# Patient Record
Sex: Male | Born: 1960 | Race: White | Hispanic: No | Marital: Married | Smoking: Never smoker
Health system: Southern US, Community
[De-identification: ages and names within clinical notes are randomized; demographics above are authoritative.]

## PROBLEM LIST (undated history)

## (undated) DIAGNOSIS — H269 Unspecified cataract: Secondary | ICD-10-CM

## (undated) DIAGNOSIS — F410 Panic disorder [episodic paroxysmal anxiety] without agoraphobia: Secondary | ICD-10-CM

## (undated) DIAGNOSIS — T7840XA Allergy, unspecified, initial encounter: Secondary | ICD-10-CM

## (undated) DIAGNOSIS — J302 Other seasonal allergic rhinitis: Secondary | ICD-10-CM

## (undated) DIAGNOSIS — H409 Unspecified glaucoma: Secondary | ICD-10-CM

## (undated) DIAGNOSIS — M1990 Unspecified osteoarthritis, unspecified site: Secondary | ICD-10-CM

## (undated) HISTORY — DX: Allergy, unspecified, initial encounter: T78.40XA

## (undated) HISTORY — PX: COLONOSCOPY: SHX174

## (undated) HISTORY — PX: REPLACEMENT TOTAL KNEE BILATERAL: SUR1225

## (undated) HISTORY — DX: Unspecified glaucoma: H40.9

## (undated) HISTORY — DX: Unspecified osteoarthritis, unspecified site: M19.90

## (undated) HISTORY — DX: Panic disorder (episodic paroxysmal anxiety): F41.0

## (undated) HISTORY — DX: Other seasonal allergic rhinitis: J30.2

## (undated) HISTORY — PX: TONSILLECTOMY: SUR1361

## (undated) HISTORY — DX: Unspecified cataract: H26.9

## (undated) HISTORY — PX: POLYPECTOMY: SHX149

## (undated) HISTORY — PX: KNEE SURGERY: SHX244

## (undated) HISTORY — PX: ROTATOR CUFF REPAIR: SHX139

---

## 1998-12-27 ENCOUNTER — Other Ambulatory Visit: Admission: RE | Admit: 1998-12-27 | Discharge: 1998-12-27 | Payer: Self-pay | Admitting: Otolaryngology

## 2005-05-21 ENCOUNTER — Inpatient Hospital Stay (HOSPITAL_COMMUNITY): Admission: EM | Admit: 2005-05-21 | Discharge: 2005-05-24 | Payer: Self-pay | Admitting: Emergency Medicine

## 2005-05-21 ENCOUNTER — Encounter: Payer: Self-pay | Admitting: Emergency Medicine

## 2005-09-12 ENCOUNTER — Emergency Department (HOSPITAL_COMMUNITY): Admission: EM | Admit: 2005-09-12 | Discharge: 2005-09-12 | Payer: Self-pay | Admitting: Emergency Medicine

## 2005-09-24 ENCOUNTER — Ambulatory Visit: Payer: Self-pay | Admitting: Internal Medicine

## 2005-09-25 ENCOUNTER — Ambulatory Visit: Payer: Self-pay | Admitting: Family Medicine

## 2005-10-02 ENCOUNTER — Ambulatory Visit: Payer: Self-pay | Admitting: Family Medicine

## 2005-10-05 ENCOUNTER — Ambulatory Visit: Payer: Self-pay | Admitting: Gastroenterology

## 2005-10-10 ENCOUNTER — Ambulatory Visit: Payer: Self-pay | Admitting: *Deleted

## 2005-10-12 ENCOUNTER — Ambulatory Visit: Payer: Self-pay

## 2005-12-25 ENCOUNTER — Ambulatory Visit: Payer: Self-pay | Admitting: Gastroenterology

## 2006-01-02 ENCOUNTER — Ambulatory Visit: Payer: Self-pay | Admitting: Gastroenterology

## 2006-01-02 ENCOUNTER — Encounter (INDEPENDENT_AMBULATORY_CARE_PROVIDER_SITE_OTHER): Payer: Self-pay | Admitting: Specialist

## 2006-07-22 ENCOUNTER — Ambulatory Visit: Payer: Self-pay | Admitting: Family Medicine

## 2006-07-22 LAB — CONVERTED CEMR LAB
ALT: 24 units/L (ref 0–40)
Albumin: 4 g/dL (ref 3.5–5.2)
Alkaline Phosphatase: 60 units/L (ref 39–117)
BUN: 17 mg/dL (ref 6–23)
CO2: 32 meq/L (ref 19–32)
Calcium: 9.7 mg/dL (ref 8.4–10.5)
Creatinine, Ser: 1.3 mg/dL (ref 0.4–1.5)
GFR calc non Af Amer: 63 mL/min
Hemoglobin: 15.2 g/dL (ref 13.0–17.0)
Lymphocytes Relative: 26 % (ref 12.0–46.0)
MCHC: 33.8 g/dL (ref 30.0–36.0)
Neutro Abs: 3.3 10*3/uL (ref 1.4–7.7)
Platelets: 175 10*3/uL (ref 150–400)
Triglyceride fasting, serum: 77 mg/dL (ref 0–149)
WBC: 5.1 10*3/uL (ref 4.5–10.5)

## 2006-07-30 ENCOUNTER — Ambulatory Visit: Payer: Self-pay | Admitting: Family Medicine

## 2007-07-15 DIAGNOSIS — Z85038 Personal history of other malignant neoplasm of large intestine: Secondary | ICD-10-CM | POA: Insufficient documentation

## 2007-08-15 ENCOUNTER — Telehealth: Payer: Self-pay | Admitting: Family Medicine

## 2007-09-09 ENCOUNTER — Ambulatory Visit: Payer: Self-pay | Admitting: Family Medicine

## 2007-09-09 LAB — CONVERTED CEMR LAB
ALT: 72 units/L — ABNORMAL HIGH (ref 0–53)
AST: 40 units/L — ABNORMAL HIGH (ref 0–37)
Basophils Absolute: 0 10*3/uL (ref 0.0–0.1)
Chloride: 106 meq/L (ref 96–112)
Direct LDL: 152.4 mg/dL
Glucose, Bld: 108 mg/dL — ABNORMAL HIGH (ref 70–99)
HCT: 44.6 % (ref 39.0–52.0)
Hemoglobin: 15.7 g/dL (ref 13.0–17.0)
Ketones, urine, test strip: NEGATIVE
MCHC: 35.1 g/dL (ref 30.0–36.0)
MCV: 89.3 fL (ref 78.0–100.0)
Nitrite: NEGATIVE
RBC: 4.99 M/uL (ref 4.22–5.81)
RDW: 12.4 % (ref 11.5–14.6)
Total Bilirubin: 1 mg/dL (ref 0.3–1.2)
Total CHOL/HDL Ratio: 6.4
Total Protein: 6.7 g/dL (ref 6.0–8.3)
Triglycerides: 151 mg/dL — ABNORMAL HIGH (ref 0–149)
VLDL: 30 mg/dL (ref 0–40)
WBC Urine, dipstick: NEGATIVE
WBC: 6.4 10*3/uL (ref 4.5–10.5)
pH: 7.5

## 2007-09-18 ENCOUNTER — Ambulatory Visit: Payer: Self-pay | Admitting: Family Medicine

## 2007-09-18 DIAGNOSIS — F4001 Agoraphobia with panic disorder: Secondary | ICD-10-CM | POA: Insufficient documentation

## 2007-09-18 DIAGNOSIS — D1739 Benign lipomatous neoplasm of skin and subcutaneous tissue of other sites: Secondary | ICD-10-CM

## 2007-09-26 ENCOUNTER — Ambulatory Visit: Payer: Self-pay | Admitting: Family Medicine

## 2007-09-26 DIAGNOSIS — L723 Sebaceous cyst: Secondary | ICD-10-CM

## 2007-10-06 ENCOUNTER — Ambulatory Visit: Payer: Self-pay | Admitting: Internal Medicine

## 2008-01-03 ENCOUNTER — Emergency Department (HOSPITAL_COMMUNITY): Admission: EM | Admit: 2008-01-03 | Discharge: 2008-01-03 | Payer: Self-pay | Admitting: Emergency Medicine

## 2008-07-09 ENCOUNTER — Encounter: Payer: Self-pay | Admitting: Family Medicine

## 2008-08-18 ENCOUNTER — Ambulatory Visit (HOSPITAL_BASED_OUTPATIENT_CLINIC_OR_DEPARTMENT_OTHER): Admission: RE | Admit: 2008-08-18 | Discharge: 2008-08-18 | Payer: Self-pay | Admitting: Orthopedic Surgery

## 2008-09-03 ENCOUNTER — Ambulatory Visit: Payer: Self-pay | Admitting: Family Medicine

## 2008-09-03 LAB — CONVERTED CEMR LAB
ALT: 51 units/L (ref 0–53)
AST: 29 units/L (ref 0–37)
Basophils Relative: 0.2 % (ref 0.0–3.0)
Bilirubin Urine: NEGATIVE
CO2: 36 meq/L — ABNORMAL HIGH (ref 19–32)
Calcium: 9.8 mg/dL (ref 8.4–10.5)
Creatinine, Ser: 1.1 mg/dL (ref 0.4–1.5)
GFR calc Af Amer: 92 mL/min
GFR calc non Af Amer: 76 mL/min
Glucose, Bld: 101 mg/dL — ABNORMAL HIGH (ref 70–99)
Glucose, Urine, Semiquant: NEGATIVE
Hemoglobin: 14.5 g/dL (ref 13.0–17.0)
Ketones, urine, test strip: NEGATIVE
Lymphocytes Relative: 24.7 % (ref 12.0–46.0)
Monocytes Absolute: 0.4 10*3/uL (ref 0.1–1.0)
Neutrophils Relative %: 64.5 % (ref 43.0–77.0)
Nitrite: NEGATIVE
Protein, U semiquant: NEGATIVE
Total Bilirubin: 0.8 mg/dL (ref 0.3–1.2)
Urobilinogen, UA: 0.2
VLDL: 22 mg/dL (ref 0–40)
WBC Urine, dipstick: NEGATIVE

## 2008-09-07 ENCOUNTER — Telehealth: Payer: Self-pay | Admitting: Family Medicine

## 2008-09-09 IMAGING — CR DG CHEST 1V PORT
1 series · 1 of 1 positions shown · non-contrast
Comparison: 05/22/05.

CLINICAL DATA: Chest pain and shortness of breath.
 PORTABLE CHEST - 1 VIEW:

[view not recorded]
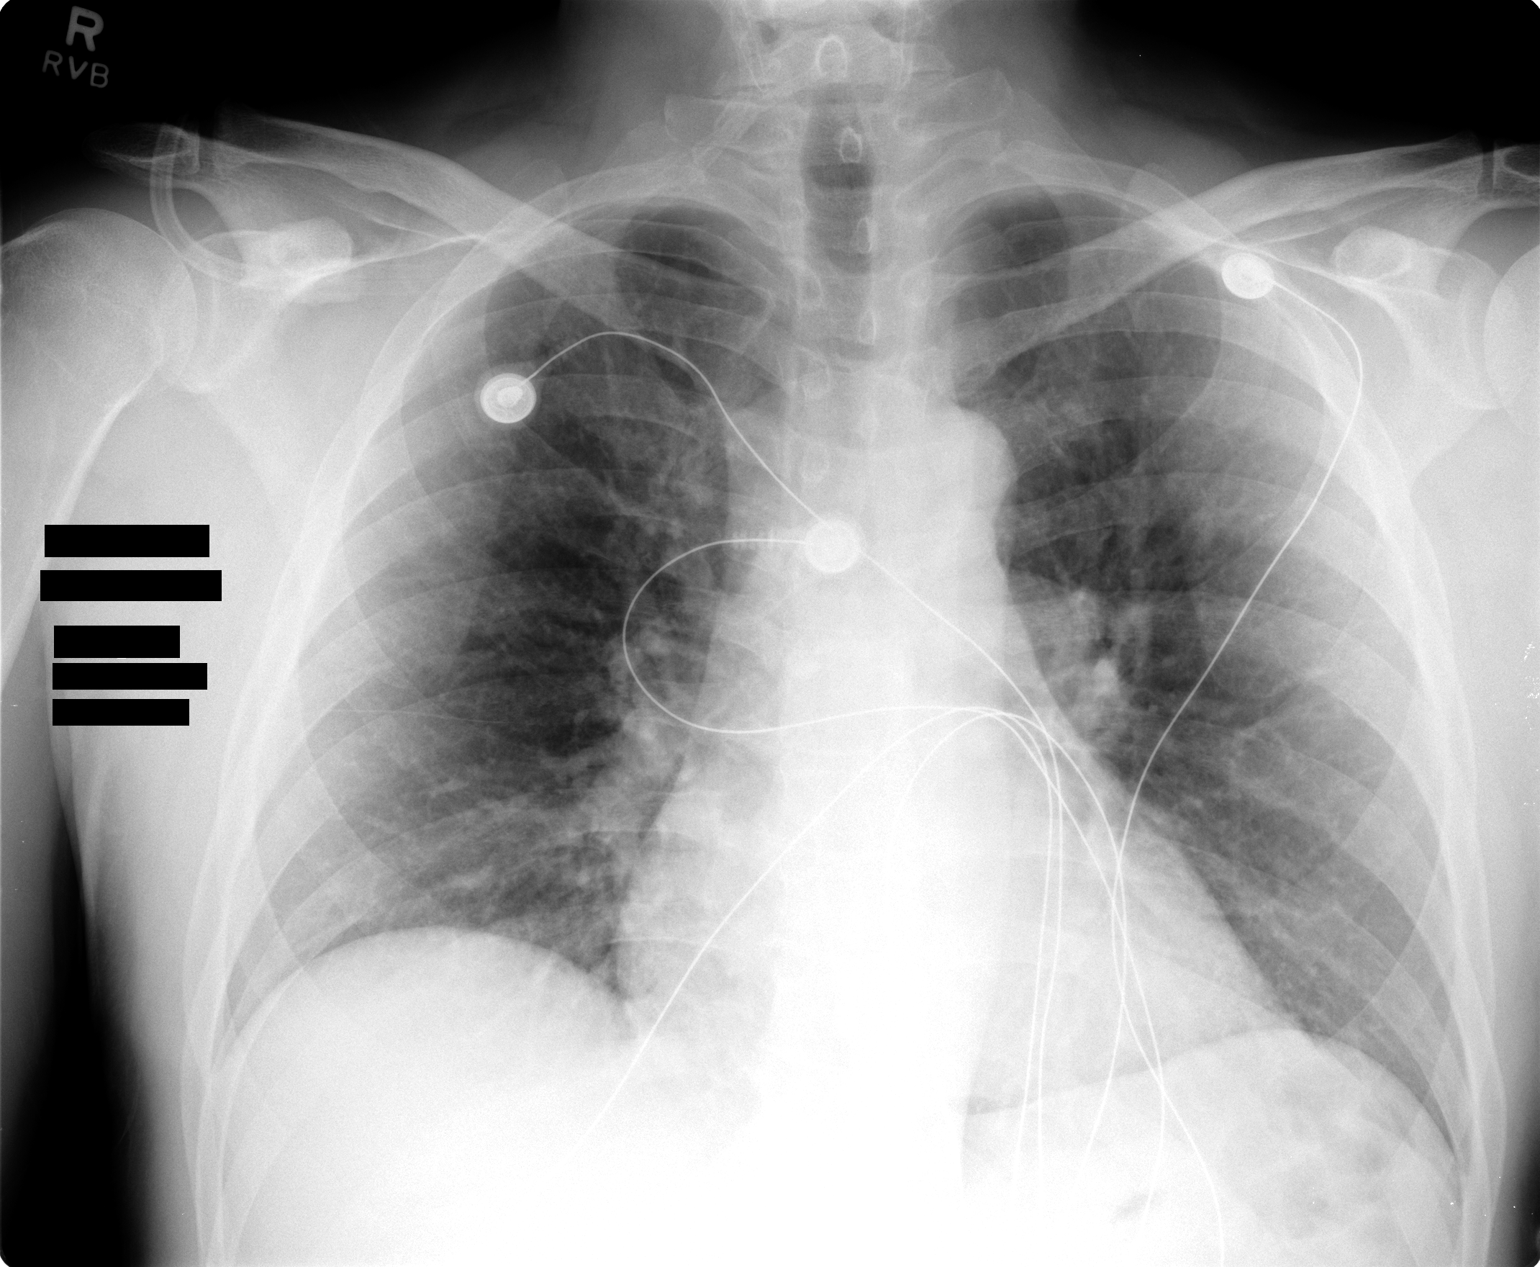

[1 of 1 positions shown; findings below may reference images not displayed]

FINDINGS: Infiltrate at the right lung base has resolved since the prior study.  Both lungs are clear.   The heart size and mediastinal contours are normal.
IMPRESSION: No active disease.

## 2008-09-17 ENCOUNTER — Ambulatory Visit: Payer: Self-pay | Admitting: Family Medicine

## 2008-09-17 DIAGNOSIS — Z860100 Personal history of colon polyps, unspecified: Secondary | ICD-10-CM | POA: Insufficient documentation

## 2008-10-12 ENCOUNTER — Telehealth: Payer: Self-pay | Admitting: Family Medicine

## 2009-04-06 ENCOUNTER — Telehealth: Payer: Self-pay | Admitting: *Deleted

## 2009-04-07 ENCOUNTER — Ambulatory Visit: Payer: Self-pay | Admitting: Family Medicine

## 2009-07-25 ENCOUNTER — Telehealth: Payer: Self-pay | Admitting: Gastroenterology

## 2009-09-05 ENCOUNTER — Ambulatory Visit: Payer: Self-pay | Admitting: Family Medicine

## 2009-09-05 LAB — CONVERTED CEMR LAB
BUN: 13 mg/dL (ref 6–23)
Basophils Absolute: 0 10*3/uL (ref 0.0–0.1)
Basophils Relative: 0.4 % (ref 0.0–3.0)
Bilirubin Urine: NEGATIVE
Blood in Urine, dipstick: NEGATIVE
Calcium: 9.4 mg/dL (ref 8.4–10.5)
Direct LDL: 162.6 mg/dL
Eosinophils Relative: 2.2 % (ref 0.0–5.0)
Glucose, Urine, Semiquant: NEGATIVE
HDL: 35.3 mg/dL — ABNORMAL LOW (ref >39.00)
Hemoglobin: 15.2 g/dL (ref 13.0–17.0)
Lymphocytes Relative: 23.7 % (ref 12.0–46.0)
Lymphs Abs: 1.4 10*3/uL (ref 0.7–4.0)
MCHC: 33.8 g/dL (ref 30.0–36.0)
MCV: 91.8 fL (ref 78.0–100.0)
Monocytes Absolute: 0.4 10*3/uL (ref 0.1–1.0)
Monocytes Relative: 6.7 % (ref 3.0–12.0)
Neutrophils Relative %: 67 % (ref 43.0–77.0)
PSA: 0.86 ng/mL (ref 0.10–4.00)
Platelets: 131 10*3/uL — ABNORMAL LOW (ref 150.0–400.0)
Protein, U semiquant: NEGATIVE
RBC: 4.91 M/uL (ref 4.22–5.81)
RDW: 12.7 % (ref 11.5–14.6)
Sodium: 145 meq/L (ref 135–145)
Total Bilirubin: 1 mg/dL (ref 0.3–1.2)
Urobilinogen, UA: 0.2
WBC: 5.7 10*3/uL (ref 4.5–10.5)

## 2009-09-19 ENCOUNTER — Ambulatory Visit: Payer: Self-pay | Admitting: Family Medicine

## 2009-12-23 ENCOUNTER — Telehealth: Payer: Self-pay | Admitting: Family Medicine

## 2010-06-08 ENCOUNTER — Telehealth: Payer: Self-pay | Admitting: *Deleted

## 2010-06-26 ENCOUNTER — Telehealth: Payer: Self-pay | Admitting: Family Medicine

## 2010-09-15 ENCOUNTER — Ambulatory Visit: Payer: Self-pay | Admitting: Family Medicine

## 2010-09-15 LAB — CONVERTED CEMR LAB
ALT: 23 U/L
AST: 24 U/L
Albumin: 3.8 g/dL
Alkaline Phosphatase: 49 U/L
BUN: 20 mg/dL
Basophils Absolute: 0 K/uL
Basophils Relative: 0.4 %
Bilirubin, Direct: 0.1 mg/dL
CO2: 33 meq/L — ABNORMAL HIGH
Calcium: 9.1 mg/dL
Chloride: 101 meq/L
Cholesterol: 157 mg/dL
Creatinine, Ser: 1 mg/dL
Eosinophils Absolute: 0.1 K/uL
Eosinophils Relative: 1.7 %
GFR calc non Af Amer: 81.02 mL/min
Glucose, Bld: 106 mg/dL — ABNORMAL HIGH
HCT: 42.9 %
HDL: 36.9 mg/dL — ABNORMAL LOW
Hemoglobin: 14.8 g/dL
Ketones, urine, test strip: NEGATIVE
LDL Cholesterol: 108 mg/dL — ABNORMAL HIGH
Lymphocytes Relative: 21.1 %
Lymphs Abs: 1 K/uL
MCHC: 34.4 g/dL
MCV: 92.8 fL
Monocytes Absolute: 0.4 K/uL
Monocytes Relative: 7.3 %
Neutro Abs: 3.4 K/uL
Neutrophils Relative %: 69.5 %
PSA: 0.82 ng/mL
Platelets: 117 K/uL — ABNORMAL LOW
Potassium: 4 meq/L
RBC: 4.62 M/uL
RDW: 13.5 %
Sodium: 141 meq/L
TSH: 1.52 u[IU]/mL
Total Bilirubin: 0.8 mg/dL
Total CHOL/HDL Ratio: 4
Total Protein: 6.6 g/dL
Triglycerides: 62 mg/dL
VLDL: 12.4 mg/dL
WBC: 4.9 10*3/microliter

## 2010-09-22 ENCOUNTER — Ambulatory Visit: Payer: Self-pay | Admitting: Family Medicine

## 2010-09-22 ENCOUNTER — Encounter: Payer: Self-pay | Admitting: Family Medicine

## 2010-09-22 DIAGNOSIS — N529 Male erectile dysfunction, unspecified: Secondary | ICD-10-CM | POA: Insufficient documentation

## 2010-09-22 DIAGNOSIS — J309 Allergic rhinitis, unspecified: Secondary | ICD-10-CM | POA: Insufficient documentation

## 2010-11-03 ENCOUNTER — Telehealth: Payer: Self-pay | Admitting: Family Medicine

## 2010-11-14 NOTE — Progress Notes (Signed)
Summary: REFILL  Phone Note Refill Request Message from:  Fax from Pharmacy  Refills Requested: Medication #1:  CIALIS 20 MG TABS UAD. [facility] FA[facility]----[phone]  Initial call taken by: Warnell Forester,  December 23, 2009 10:09 AM    Prescriptions: Prudy Feeler 1 MG  TABS (ALPRAZOLAM) 1 tab 6 x per day  #540 x 3   Entered by:   Kern Reap CMA (AAMA)   Authorized by:   Roderick Pee MD   Signed by:   Kern Reap CMA (AAMA) on 12/23/2009   Method used:   Print then Give to Patient   RxID:   [id number] CIALIS 20 MG TABS (TADALAFIL) UAD  #6 x 11   Entered by:   Kern Reap CMA (AAMA)   Authorized by:   Roderick Pee MD   Signed by:   Kern Reap CMA (AAMA) on 12/23/2009   Method used:   Electronically to        [facility]* (mail-order)             ,          Ph: [phone]       Fax: [phone]   RxID:   [id number] PIRJJ 1 MG  TABS (ALPRAZOLAM) 1 tab 6 x per day  #540 x 3   Entered by:   Kern Reap CMA (AAMA)   Authorized by:   Roderick Pee MD   Signed by:   Kern Reap CMA (AAMA) on 12/23/2009   Method used:   Print then Give to Patient   RxID:   [id number]

## 2010-11-14 NOTE — Assessment & Plan Note (Signed)
Summary: cpx/njr   Vital Signs:  Patient profile:   50 year old male Height:      73 inches Weight:      212 pounds BMI:     28.07 Temp:     98.3 degrees F oral BP sitting:   110 / 74  (left arm) Cuff size:   regular  Vitals Entered By: Kern Reap CMA Duncan Dull) (September 22, 2010 10:40 AM)  History of Present Illness: Annette Stable is a 50 year old male, married, nonsmoker, who comes in today for general physical examination because of the history of underlying Agoral phobia with panic disorder, for which she takes Xanax 1 mg 6 times per day.  Also has a tremor benign for which he takes Tenormin 50 mg daily.  He uses Flonase nasal spray and Allegra for allergic rhinitis and Cialis 20 mg p.r.n. for ED.  He gets routine eye care, dental care, colonoscopy, 2007 showed some, polyps.  He's on the recall  list.  Tetanus 2007 declines a flu shot.  He's currently on fish oil b.i.d. from his rheumatologist for pain in his knees.  He would also like a testosterone level.  Is concerned about his issues with erectile dysfunction.  However, his anatomy is normal  His oldest son is now a Holiday representative at [employer], another son, a Printmaker in college, and a daughter, who is a Holiday representative at [employer].  He and his wife are having marital problems and currently seeing a marriage counselor  Allergies: 1)  ! Codeine  Past History:  Past medical, surgical, family and social histories (including risk factors) reviewed, and no changes noted (except as noted below).  Past Medical History: Reviewed history from 09/17/2008 and no changes required. Panic Attack Colonic polyps, hx of  Past Surgical History: Reviewed history from 09/17/2008 and no changes required. Tonsillectomy Colonoscopy Panendoscopy right and left shoulder surgery 09  Family History: Reviewed history from 09/18/2007 and no changes required. Family History of Fibromyalgia Family History of Bowel disease Family History Breast cancer 1st degree relative  <50 Family History of Colon CA 1st degree relative <60  Social History: Reviewed history from 09/17/2008 and no changes required. Occupation: Married Never Smoked Alcohol use-no Drug use-no Regular exercise-yes  Review of Systems      See HPI  Physical Exam  General:  Well-developed,well-nourished,in no acute distress; alert,appropriate and cooperative throughout examination Head:  Normocephalic and atraumatic without obvious abnormalities. No apparent alopecia or balding. Eyes:  No corneal or conjunctival inflammation noted. EOMI. Perrla. Funduscopic exam benign, without hemorrhages, exudates or papilledema. Vision grossly normal. Ears:  External ear exam shows no significant lesions or deformities.  Otoscopic examination reveals clear canals, tympanic membranes are intact bilaterally without bulging, retraction, inflammation or discharge. Hearing is grossly normal bilaterally. Nose:  External nasal examination shows no deformity or inflammation. Nasal mucosa are pink and moist without lesions or exudates. Mouth:  Oral mucosa and oropharynx without lesions or exudates.  Teeth in good repair. Neck:  No deformities, masses, or tenderness noted. Chest Wall:  No deformities, masses, tenderness or gynecomastia noted. Breasts:  No masses or gynecomastia noted Lungs:  Normal respiratory effort, chest expands symmetrically. Lungs are clear to auscultation, no crackles or wheezes. Heart:  Normal rate and regular rhythm. S1 and S2 normal without gallop, murmur, click, rub or other extra sounds. Abdomen:  Bowel sounds positive,abdomen soft and non-tender without masses, organomegaly or hernias noted. Rectal:  No external abnormalities noted. Normal sphincter tone. No rectal masses or tenderness. Genitalia:  Testes bilaterally descended without nodularity, tenderness or masses. No scrotal masses or lesions. No penis lesions or urethral discharge. Prostate:  Prostate gland firm and smooth, no  enlargement, nodularity, tenderness, mass, asymmetry or induration. Msk:  No deformity or scoliosis noted of thoracic or lumbar spine.   Pulses:  R and L carotid,radial,femoral,dorsalis pedis and posterior tibial pulses are full and equal bilaterally Extremities:  No clubbing, cyanosis, edema, or deformity noted with normal full range of motion of all joints.   Neurologic:  No cranial nerve deficits noted. Station and gait are normal. Plantar reflexes are down-going bilaterally. DTRs are symmetrical throughout. Sensory, motor and coordinative functions appear intact. Skin:  Intact without suspicious lesions or rashes Cervical Nodes:  No lymphadenopathy noted Axillary Nodes:  No palpable lymphadenopathy Inguinal Nodes:  No significant adenopathy Psych:  Cognition and judgment appear intact. Alert and cooperative with normal attention span and concentration. No apparent delusions, illusions, hallucinations   Impression & Recommendations:  Problem # 1:  AGORAPHOBIA WITH PANIC DISORDER (ICD-300.21) Assessment Improved  Orders: Prescription Created Electronically [phone])  Problem # 2:  PHYSICAL EXAMINATION (ICD-V70.0) Assessment: Unchanged  Orders: Prescription Created Electronically [phone]) EKG w/ Interpretation (93000)  Problem # 3:  ERECTILE DYSFUNCTION, ORGANIC (ICD-607.84) Assessment: New  His updated medication list for this problem includes:    Cialis 20 Mg Tabs (Tadalafil) ..... Uad  Orders: Venipuncture (95621) TLB-Testosterone, Total (84403-TESTO) Prescription Created Electronically [phone])  Problem # 4:  ALLERGIC RHINITIS (ICD-477.9) Assessment: Unchanged  His updated medication list for this problem includes:    Flonase 50 Mcg/act Susp (Fluticasone propionate) .Marland Kitchen... As needed    Allegra 180 Mg Tabs (Fexofenadine hcl) .Marland Kitchen... Take 1 tablet by mouth every morning  Orders: Prescription Created Electronically [phone])  Complete Medication List: 1)  Tenormin 50 Mg Tabs  (Atenolol) .... Take 1 tablet by mouth once a day 2)  Flonase 50 Mcg/act Susp (Fluticasone propionate) .... As needed 3)  Xanax 1 Mg Tabs (Alprazolam) .Marland Kitchen.. 1 tab 6 x per day 4)  Allegra 180 Mg Tabs (Fexofenadine hcl) .... Take 1 tablet by mouth every morning 5)  Cialis 20 Mg Tabs (Tadalafil) .... Uad 6)  Lovaza 1 Gm Caps (Omega-3-acid ethyl esters) .... Take one tab two times a day  Patient Instructions: 1)  Please schedule a follow-up appointment in 1 year. 2)  It is important that you exercise regularly at least 20 minutes 5 times a week. If you develop chest pain, have severe difficulty breathing, or feel very tired , stop exercising immediately and seek medical attention. 3)  Take an Aspirin every day. Prescriptions: XANAX 1 MG  TABS (ALPRAZOLAM) 1 tab 6 x per day  #540 x 3   Entered and Authorized by:   Roderick Pee MD   Signed by:   Roderick Pee MD on 09/22/2010   Method used:   Print then Give to Patient   RxID:   [id number] CIALIS 20 MG TABS (TADALAFIL) UAD  #6 x 4   Entered and Authorized by:   Roderick Pee MD   Signed by:   Roderick Pee MD on 09/22/2010   Method used:   Electronically to        [facility]* (retail)             ,          Ph: [phone]       Fax: [phone]   RxID:   [id number] ALLEGRA 180 MG TABS (FEXOFENADINE HCL) Take  1 tablet by mouth every morning  #100 x 3   Entered and Authorized by:   Roderick Pee MD   Signed by:   Roderick Pee MD on 09/22/2010   Method used:   Electronically to        [facility]* (retail)             ,          Ph: [phone]       Fax: [phone]   RxID:   [id number] FLONASE 50 MCG/ACT  SUSP (FLUTICASONE PROPIONATE) as needed  #3 x 3   Entered and Authorized by:   Roderick Pee MD   Signed by:   Roderick Pee MD on 09/22/2010   Method used:   Electronically to        [facility]* (retail)             ,          Ph: [phone]       Fax: [phone]   RxID:    [id number] TENORMIN 50 MG  TABS (ATENOLOL) Take 1 tablet by mouth once a day  #100 x 3   Entered and Authorized by:   Roderick Pee MD   Signed by:   Roderick Pee MD on 09/22/2010   Method used:   Electronically to        [facility]* (retail)             ,          Ph: [phone]       Fax: [phone]   RxID:   [id number]    Orders Added: 1)  Venipuncture [25427] 2)  TLB-Testosterone, Total [84403-TESTO] 3)  Prescription Created Electronically [G8553] 4)  Est. Patient 40-64 years [99396] 5)  EKG w/ Interpretation [93000]      Preventive Care Screening  Colonoscopy:    Date:  10/15/2005    Results:  polyps

## 2010-11-14 NOTE — Progress Notes (Signed)
Summary: cialis refill  Phone Note Refill Request Message from:  Fax from Pharmacy on June 26, 2010 12:29 PM  Refills Requested: Medication #1:  CIALIS 20 MG TABS UAD. Initial call taken by: Kern Reap CMA Duncan Dull),  June 26, 2010 12:29 PM    Prescriptions: CIALIS 20 MG TABS (TADALAFIL) UAD  #6 x 4   Entered by:   Kern Reap CMA (AAMA)   Authorized by:   Roderick Pee MD   Signed by:   Kern Reap CMA (AAMA) on 06/26/2010   Method used:   Electronically to        [facility] (retail)             ,          Ph: [phone]       Fax: [phone]   RxID:   [id number]

## 2010-11-14 NOTE — Progress Notes (Signed)
  Phone Note Refill Request Message from:  Fax from Pharmacy on June 08, 2010 11:24 AM  Refills Requested: Medication #1:  XANAX 1 MG  TABS 1 tab 6 x per day Initial call taken by: Kern Reap CMA Duncan Dull),  June 08, 2010 11:24 AM    Prescriptions: Prudy Feeler 1 MG  TABS (ALPRAZOLAM) 1 tab 6 x per day  #540 x 3   Entered by:   Kern Reap CMA (AAMA)   Authorized by:   Roderick Pee MD   Signed by:   Kern Reap CMA (AAMA) on 06/08/2010   Method used:   Print then Give to Patient   RxID:   [id number] Prudy Feeler 1 MG  TABS (ALPRAZOLAM) 1 tab 6 x per day  #540 x 3   Entered by:   Kern Reap CMA (AAMA)   Authorized by:   Roderick Pee MD   Signed by:   Kern Reap CMA (AAMA) on 06/08/2010   Method used:   Print then Give to Patient   RxID:   [id number]

## 2010-11-16 NOTE — Progress Notes (Signed)
Summary: REFILL REQUEST (PLEASE SEE NOTATION)  Phone Note Refill Request Message from:  Patient on November 03, 2010 1:56 PM  Refills Requested: Medication #1:  XANAX 1 MG  TABS 1 tab 6 x per day   Notes: Pt will bring form by that needs to be faxed in w/ RX... Please sign dispense as written.  See Notation. needs brand name.   Initial call taken by: Debbra Riding,  November 03, 2010 1:56 PM    Prescriptions: Prudy Feeler 1 MG  TABS (ALPRAZOLAM) 1 tab 6 x per day  #540 x 3   Entered by:   Kern Reap CMA (AAMA)   Authorized by:   Roderick Pee MD   Signed by:   Kern Reap CMA (AAMA) on 11/03/2010   Method used:   Print then Give to Patient   RxID:   [id number]

## 2011-02-20 ENCOUNTER — Ambulatory Visit (AMBULATORY_SURGERY_CENTER): Payer: BC Managed Care – PPO

## 2011-02-20 VITALS — Ht 73.0 in | Wt 213.2 lb

## 2011-02-20 DIAGNOSIS — Z800 Family history of malignant neoplasm of digestive organs: Secondary | ICD-10-CM

## 2011-02-20 DIAGNOSIS — D131 Benign neoplasm of stomach: Secondary | ICD-10-CM

## 2011-02-20 DIAGNOSIS — Z860100 Personal history of colon polyps, unspecified: Secondary | ICD-10-CM

## 2011-02-20 MED ORDER — PEG-KCL-NACL-NASULF-NA ASC-C 100 G PO SOLR: 1.0000 | Freq: Once | ORAL | Status: AC

## 2011-02-21 ENCOUNTER — Encounter: Payer: Self-pay | Admitting: Gastroenterology

## 2011-02-27 NOTE — Op Note (Signed)
NAME:  Logan [name], Logan [name] NO.:  [id number]   MEDICAL RECORD NO.:  [id number]          PATIENT TYPE:  AMB   LOCATION:  DSC                          FACILITY:  [facility]   PHYSICIAN:  Harvie Junior, M.D.   DATE OF BIRTH:  1961/08/29   DATE OF PROCEDURE:  DATE OF DISCHARGE:                               OPERATIVE REPORT   PREOPERATIVE DIAGNOSES:  Impingement and AC joint arthritis.   POSTOPERATIVE DIAGNOSES:  1. Impingement.  2. Impingement from the distal clavicle.  3. Superior labial tear anterior to posterior.  4. Undersurface rotator cuff tear.  5. Articular cartilage defect on the anterior inferior glenoid.   PRINCIPAL PROCEDURE:  1. Arthroscopic subacromial decompression of the lateral and posterior      compartment.  2. Arthroscopic distal clavicle resection to an anterior compartment.  3. Debridement within the glenohumeral joint of undersurface rotator      cuff tear of anterior inferior articular cartilage defect.  4. Superior labial tear anterior to posterior.   SURGEON:  Harvie Junior, M.D.   ASSISTANT:  Marshia Ly, P.A.   ANESTHESIA:  General.   BRIEF HISTORY:  Logan [name] is a 50 year old gentleman with a long  history of having had significant pain in bilateral shoulders.  We had  seen him in the office.  He had undergone injection therapy, which had  helped, but pain persisted despite physical therapy over a period of  time.  Because he continued to complain of pain, he was ultimately taken  to the operating room for operative shoulder arthroscopy.   PROCEDURE:  The patient was taken to the operating room.  After adequate  anesthesia was obtained with general anesthetic, the patient was placed  supine on the operating room table.  Left shoulder was prepped and  draped in the usual sterile fashion.  Following this, a routine  arthroscopic examination of the shoulder revealed there was an obvious  undersurface rotator cuff tear that was quite  significant over the  entire length of the supraspinatus.  We debrided this back to a smooth  and stable rim and we are still concerned about the high-grade partial  nature of it at this point, we used a spinal nail and marked the rotator  cuff within the glenohumeral joint to give [place] some sense of where this  would be, so we can look at it from the topside.  At this point, the  attention was turned to the anterior inferior glenoid where there was an  articular cartilage defect, which was debrided.  Attention was turned to  the superior labrum, which was torn and this was anterior to posterior.  Took the biceps tendon at that point and tried to force this in the  joint, but the biceps tendon was well anchored superiorly.  At this  time, attention was turned towards out of the glenohumeral joint into  the subacromial space and an anterolateral acromioplasty was performed  for the lateral and posterior compartments.  The rotator cuff was then  probed and evaluated at a length from the superior area.  We identified  where the  suture pierced the rotator cuff and then probed that at length  with both the arm and abduction and an adduction, and felt that the  rotator cuff was sufficient thickness to withstand this situation.  Once  the acromioplasty was performed, we looked at the distal clavicle.  Initial plan was to leave the distal clavicle alone, but it did appear  to be an impinging lesion and because of the findings of the superior  labral issues as well as the high-grade partial nature of the cuff tear,  we felt that we really needed to decompress this shoulder well, so a  distal clavicle resection was performed over 18 mm and once this was  performed a subtotal bursectomy was performed and the shoulder was  copiously and thoroughly irrigated and suction dried.  The arthroscopic  portals were then closed with the bandage and sterile compression  dressing was applied.  The patient was  taken to the recovery room and  was noted to be in a satisfactory condition.  Estimated blood loss for  procedure was none.      Harvie Junior, M.D.  Electronically Signed     JLG/MEDQ  D:  08/18/2008  T:  08/19/2008  Job:  [id number]

## 2011-03-02 NOTE — Discharge Summary (Signed)
NAME:  DECKLAN, MAU NO.:  [id number]   MEDICAL RECORD NO.:  [id number]          PATIENT TYPE:  INP   LOCATION:  5020                         FACILITY:  [facility]   PHYSICIAN:  Cherylynn Ridges, M.D.    DATE OF BIRTH:  1961/06/22   DATE OF ADMISSION:  05/21/2005  DATE OF DISCHARGE:  05/24/2005                                 DISCHARGE SUMMARY   DISCHARGE DIAGNOSES:  1.  Fall during wake-boarding.  2.  Intra-abdominal injury, likely mesenteric.  3.  Left hip contusion.  4.  Left medial collateral ligament tear.  5.  Anxiety disorder not otherwise specified.  6.  Acute blood loss anemia.  7.  Mild thrombocytopenia.   CONSULTS:  Dr. Luiz Blare, orthopedics.   PROCEDURES:  None.   HISTORY OF PRESENT ILLNESS:  This is a 50 year old white male who was wake-  boarding the day before presentation and fell hard on his left side.  He  developed progressive pain in his left shoulder and epigastrium.  He also  was having significant left knee pain and had gone to see his orthopedic  surgeon, Dr. Luiz Blare.  Dr. Luiz Blare and/or his P.A. thought that he might  potentially have a pneumothorax and sent him to be evaluated.   The patient's workup included a chest x-ray which was negative.  He also had  a CT of the abdomen and pelvis which showed a large amount of free fluid  from a likely mesenteric injury although a small spleen laceration was not  excluded.  He was admitted for observation and serial CBCs.   HOSPITAL COURSE:  The patient did well in the hospital.  His hemoglobin  drifted down for a couple of days until it stabilized and then trended up on  his last hospital day.  He was felt well enough to go to MRI towards the end  of his hospital course which demonstrated the medial collateral ligament  tear.  He was prescribed a hinged knee brace when ambulating for that.  He  was felt to be able to be discharged home in good condition on his last  hospital day.   DISCHARGE  MEDICATIONS:  1.  Vicodin 5/500, take 1-2 p.o. q.6h. p.r.n. pain, #50 with no refill.  2.  Continue his home medications which include Tenormin and Xanax for his      anxiety disorder.   Finally, he was taking an aspirin daily plus Motrin for various orthopedic  complaints which is to stop for the next 2 months.   FOLLOW-UP:  The patient is to follow up with Dr. Luiz Blare in two weeks for his  collateral ligament tear.  He is to call the trauma service if he develops  any problems.  He was warned against any vigorous exercise for the next two  months although he was cleared to fly next week.      Earney Hamburg, P.A.      Cherylynn Ridges, M.D.  Electronically Signed    MJ/MEDQ  D:  05/24/2005  T:  05/24/2005  Job:  [id number]   cc:   Harvie Junior,  M.D.  [address]  [place]  [place] [address]  Fax: [phone]

## 2011-03-05 ENCOUNTER — Encounter: Payer: Self-pay | Admitting: Gastroenterology

## 2011-03-05 DIAGNOSIS — K5730 Diverticulosis of large intestine without perforation or abscess without bleeding: Secondary | ICD-10-CM | POA: Insufficient documentation

## 2011-03-06 ENCOUNTER — Ambulatory Visit (AMBULATORY_SURGERY_CENTER): Payer: BC Managed Care – PPO | Admitting: Gastroenterology

## 2011-03-06 ENCOUNTER — Encounter: Payer: Self-pay | Admitting: Gastroenterology

## 2011-03-06 DIAGNOSIS — Z800 Family history of malignant neoplasm of digestive organs: Secondary | ICD-10-CM

## 2011-03-06 DIAGNOSIS — Z860100 Personal history of colon polyps, unspecified: Secondary | ICD-10-CM

## 2011-03-06 DIAGNOSIS — D131 Benign neoplasm of stomach: Secondary | ICD-10-CM

## 2011-03-06 DIAGNOSIS — Z1211 Encounter for screening for malignant neoplasm of colon: Secondary | ICD-10-CM

## 2011-03-06 MED ORDER — SODIUM CHLORIDE 0.9 % IV SOLN: 500.0000 mL | INTRAVENOUS | Status: DC

## 2011-03-06 NOTE — Progress Notes (Signed)
LAST PROCEDURE PT HAD FENTANYL, 14 MG VERSED AND REMEMBERS PROCEDURE. PER DR STARK, START WITH 25 MG BEANADYL. MEDICINES TITRATED PER MD. Brandy Hale, RN

## 2011-03-07 ENCOUNTER — Telehealth: Payer: Self-pay | Admitting: *Deleted

## 2011-03-07 NOTE — Telephone Encounter (Signed)

## 2011-03-19 ENCOUNTER — Other Ambulatory Visit: Payer: Self-pay | Admitting: *Deleted

## 2011-03-19 ENCOUNTER — Other Ambulatory Visit: Payer: Self-pay | Admitting: Family Medicine

## 2011-03-19 MED ORDER — ALPRAZOLAM 1 MG PO TABS: 1.0000 mg | ORAL_TABLET | Freq: Four times a day (QID) | ORAL | Status: DC

## 2011-03-19 NOTE — Telephone Encounter (Signed)
Pt called to check on status for script for xanax 1 mg, name brand only, dispensed as written #540 6 times per day. Pls be sure that this med is sent in to [facility] asap.

## 2011-03-19 NOTE — Telephone Encounter (Signed)
Pt states he dropped on fax order from for xanax refill to be sent to [facility] one week ago today.  [facility] still has not received refill request.  Pt checking the status of refill, also please make sure signature is for dispense as written.

## 2011-03-21 MED ORDER — XANAX 1 MG PO TABS: 1.0000 mg | ORAL_TABLET | Freq: Every day | ORAL | Status: DC

## 2011-07-09 LAB — POCT CARDIAC MARKERS
CKMB, poc: <1 — ABNORMAL LOW
Myoglobin, poc: 27.2
Operator id: 4661
Troponin i, poc: <0.05

## 2011-07-09 LAB — CBC
MCHC: 33.8
MCV: 88.3
Platelets: 132 — ABNORMAL LOW
RBC: 5.06
WBC: 9.2

## 2011-07-09 LAB — DIFFERENTIAL
Basophils Relative: 0
Eosinophils Absolute: 0.1
Eosinophils Relative: 1
Monocytes Relative: 7
Neutrophils Relative %: 69

## 2011-07-09 LAB — BASIC METABOLIC PANEL
BUN: 17
Calcium: 9.4
Chloride: 104
Creatinine, Ser: 1.08
GFR calc Af Amer: >60

## 2011-07-17 LAB — BASIC METABOLIC PANEL
CO2: 29
Chloride: 102
Creatinine, Ser: 1.06
GFR calc Af Amer: >60
Potassium: 4.2
Sodium: 138

## 2011-09-18 ENCOUNTER — Telehealth: Payer: Self-pay | Admitting: Family Medicine

## 2011-09-18 NOTE — Telephone Encounter (Signed)
Refill Xanax and Tenormin written out mail order. He would like to pick up today. Thanks.

## 2011-09-18 NOTE — Telephone Encounter (Signed)
Ok per dr Tawanna Cooler

## 2011-09-19 ENCOUNTER — Telehealth: Payer: Self-pay | Admitting: Family Medicine

## 2011-09-19 MED ORDER — XANAX 1 MG PO TABS: 1.0000 mg | ORAL_TABLET | Freq: Every day | ORAL | Status: DC

## 2011-09-19 MED ORDER — ATENOLOL 50 MG PO TABS: 50.0000 mg | ORAL_TABLET | Freq: Every day | ORAL | Status: DC

## 2011-09-19 NOTE — Telephone Encounter (Signed)
Opened in error

## 2011-09-19 NOTE — Telephone Encounter (Signed)
Rx ready for pick up.  Left message on machine for patient. 

## 2011-09-19 NOTE — Telephone Encounter (Signed)
Pt would like you to contact him before you fax rx in. Pt is requesting only Name Brand

## 2011-10-04 ENCOUNTER — Other Ambulatory Visit (INDEPENDENT_AMBULATORY_CARE_PROVIDER_SITE_OTHER): Payer: BC Managed Care – PPO

## 2011-10-04 DIAGNOSIS — Z0000 Encounter for general adult medical examination without abnormal findings: Secondary | ICD-10-CM

## 2011-10-04 LAB — CBC WITH DIFFERENTIAL/PLATELET
Basophils Absolute: 0 10*3/uL (ref 0.0–0.1)
Basophils Relative: 0.4 % (ref 0.0–3.0)
Eosinophils Absolute: 0.1 10*3/uL (ref 0.0–0.7)
HCT: 45.3 % (ref 39.0–52.0)
Hemoglobin: 15.4 g/dL (ref 13.0–17.0)
Lymphs Abs: 1.5 10*3/uL (ref 0.7–4.0)
MCHC: 33.9 g/dL (ref 30.0–36.0)
MCV: 91.6 fl (ref 78.0–100.0)
Neutro Abs: 4 10*3/uL (ref 1.4–7.7)
RBC: 4.95 Mil/uL (ref 4.22–5.81)
RDW: 13.4 % (ref 11.5–14.6)

## 2011-10-04 LAB — POCT URINALYSIS DIPSTICK
Bilirubin, UA: NEGATIVE
Ketones, UA: NEGATIVE
Leukocytes, UA: NEGATIVE
Protein, UA: NEGATIVE
Spec Grav, UA: 1.015

## 2011-10-04 LAB — HEPATIC FUNCTION PANEL
AST: 21 U/L (ref 0–37)
Albumin: 4.2 g/dL (ref 3.5–5.2)
Total Protein: 7.1 g/dL (ref 6.0–8.3)

## 2011-10-04 LAB — BASIC METABOLIC PANEL
CO2: 31 mEq/L (ref 19–32)
Chloride: 106 mEq/L (ref 96–112)
Glucose, Bld: 102 mg/dL — ABNORMAL HIGH (ref 70–99)
Potassium: 4.8 mEq/L (ref 3.5–5.1)
Sodium: 143 mEq/L (ref 135–145)

## 2011-10-04 LAB — LIPID PANEL
Cholesterol: 176 mg/dL (ref 0–200)
HDL: 46.8 mg/dL (ref >39.00)
VLDL: 12.4 mg/dL (ref 0.0–40.0)

## 2011-10-04 LAB — PSA: PSA: 0.99 ng/mL (ref 0.10–4.00)

## 2011-10-25 ENCOUNTER — Ambulatory Visit (INDEPENDENT_AMBULATORY_CARE_PROVIDER_SITE_OTHER): Payer: BC Managed Care – PPO | Admitting: Family Medicine

## 2011-10-25 ENCOUNTER — Encounter: Payer: Self-pay | Admitting: Family Medicine

## 2011-10-25 DIAGNOSIS — F4001 Agoraphobia with panic disorder: Secondary | ICD-10-CM

## 2011-10-25 DIAGNOSIS — J309 Allergic rhinitis, unspecified: Secondary | ICD-10-CM

## 2011-10-25 DIAGNOSIS — Z0000 Encounter for general adult medical examination without abnormal findings: Secondary | ICD-10-CM

## 2011-10-25 MED ORDER — ALPRAZOLAM 1 MG PO TABS: 1.0000 mg | ORAL_TABLET | Freq: Every day | ORAL | Status: DC

## 2011-10-25 MED ORDER — ATENOLOL 50 MG PO TABS: 50.0000 mg | ORAL_TABLET | Freq: Every day | ORAL | Status: DC

## 2011-10-25 NOTE — Progress Notes (Signed)
  Subjective:    Patient ID: Logan [name], male    DOB: [birth year], 51 y.o.   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  Intermittent constipation with internal and external hemorrhoids and a new lesion on his left lower eyelid.  He takes Tenormin 50 mg daily, and Xanax 1 mg 6 times daily for anxiety.  He's tried to taper the Xanax, but every time he does he goes through withdrawal.  He is content to continue to take the medication.  He takes Celebrex 200 mg daily for os to arthritis of his knees.  Recommended Motrin 600 b.i.d. As a substitute.  He sees a rheumatologist, and an orthopedist for his knees.  His intermittent constipation, which showed developed some in the paternal external hemorrhoids.  Advised milk of Magnesia.  Tetanus was 2007 seasonal flu shot 2012     Review of Systems  Constitutional: Negative.   HENT: Negative.   Eyes: Negative.   Respiratory: Negative.   Cardiovascular: Negative.   Gastrointestinal: Negative.   Genitourinary: Negative.   Musculoskeletal: Negative.   Skin: Negative.   Neurological: Negative.   Hematological: Negative.   Psychiatric/Behavioral: Negative.        Objective:   Physical Exam  Constitutional: He is oriented to person, place, and time. He appears well-developed and well-nourished.  HENT:  Head: Normocephalic and atraumatic.  Right Ear: External ear normal.  Left Ear: External ear normal.  Nose: Nose normal.  Mouth/Throat: Oropharynx is clear and moist.  Eyes: Conjunctivae and EOM are normal. Pupils are equal, round, and reactive to light.  Neck: Normal range of motion. Neck supple. No JVD present. No tracheal deviation present. No thyromegaly present.  Cardiovascular: Normal rate, regular rhythm, normal heart sounds and intact distal pulses.  Exam reveals no gallop  and no friction rub.   No murmur heard. Pulmonary/Chest: Effort normal and breath sounds normal. No stridor. No respiratory distress. He has no wheezes. He has no rales. He exhibits no tenderness.  Abdominal: Soft. Bowel sounds are normal. He exhibits no distension and no mass. There is no tenderness. There is no rebound and no guarding.  Genitourinary: Rectum normal, prostate normal and penis normal. Guaiac negative stool. No penile tenderness.       External exam normal.  He has a very small, scarred down, external hemorrhoid tag at the 6 o'clock position  Musculoskeletal: Normal range of motion. He exhibits no edema and no tenderness.  Lymphadenopathy:    He has no cervical adenopathy.  Neurological: He is alert and oriented to person, place, and time. He has normal reflexes. No cranial nerve deficit. He exhibits normal muscle tone.  Skin: Skin is warm and dry. No rash noted. No erythema. No pallor.  Psychiatric: He has a normal mood and affect. His behavior is normal. Judgment and thought content normal.          Assessment & Plan:  Healthy male.  History of anxiety.  Continue current medication per patient request.  He declines to try to taper.  Osteoarthritis.  Switch to Motrin, 600 b.i.d.  Return in one year, sooner for any problems

## 2011-10-25 NOTE — Patient Instructions (Signed)
Continue current medications  When you or two weeks from being out of The Xanax........call for refills.  Return in one year, sooner for any problems.  The recommended dose of Motrin would be 600 mg twice daily with food instead of the Celebrex

## 2011-11-29 ENCOUNTER — Telehealth: Payer: Self-pay | Admitting: Family Medicine

## 2011-11-29 NOTE — Telephone Encounter (Signed)
Pt called and said that he would like to get a script for Effexor XR 75mg ,NAME BRAND ONLY as previously discussed during cpx exam in January 2013. [facility] [address] [phone].  Pls notify pt when this has been done or if any questions.

## 2011-11-30 ENCOUNTER — Telehealth: Payer: Self-pay | Admitting: *Deleted

## 2011-11-30 MED ORDER — EFFEXOR 75 MG PO TABS: 75.0000 mg | ORAL_TABLET | Freq: Two times a day (BID) | ORAL | Status: DC

## 2011-11-30 MED ORDER — EFFEXOR XR 75 MG PO CP24: 75.0000 mg | ORAL_CAPSULE | Freq: Every day | ORAL | Status: DC

## 2011-11-30 NOTE — Telephone Encounter (Signed)
rx clairification 

## 2012-01-18 ENCOUNTER — Telehealth: Payer: Self-pay | Admitting: Family Medicine

## 2012-01-18 NOTE — Telephone Encounter (Signed)
Pt needs a refill on Effexor XR. He is also requesting a dosage change, which he states that he's spoken to Dr. Tawanna Cooler about. Would like to go from 75mg  to 150mg  once a day. [facility] [address] Please write "DISPENSE AS WRITTEN" & it must be NAME BRAND. Thanks!

## 2012-01-21 NOTE — Telephone Encounter (Signed)
Okay to increase Effexor to 150 mg daily dispense 100 tabs directions 1 daily refills x3

## 2012-01-22 MED ORDER — EFFEXOR XR 150 MG PO CP24: 150.0000 mg | ORAL_CAPSULE | Freq: Every day | ORAL | Status: DC

## 2012-01-22 NOTE — Telephone Encounter (Signed)
Addended by: Kern Reap B on: 01/22/2012 12:48 PM   Modules accepted: Orders

## 2012-02-20 ENCOUNTER — Other Ambulatory Visit: Payer: Self-pay | Admitting: Family Medicine

## 2012-02-20 DIAGNOSIS — F4001 Agoraphobia with panic disorder: Secondary | ICD-10-CM

## 2012-02-20 NOTE — Telephone Encounter (Signed)
Pt needs name brand only xanax 1mg  #540 for [facility] day supply [facility]

## 2012-02-22 MED ORDER — ALPRAZOLAM 1 MG PO TABS: 1.0000 mg | ORAL_TABLET | Freq: Every day | ORAL | Status: DC

## 2012-02-22 NOTE — Telephone Encounter (Signed)
Rx faxed

## 2012-05-19 ENCOUNTER — Other Ambulatory Visit: Payer: Self-pay | Admitting: *Deleted

## 2012-05-19 DIAGNOSIS — F4001 Agoraphobia with panic disorder: Secondary | ICD-10-CM

## 2012-05-19 NOTE — Telephone Encounter (Signed)
ok 

## 2012-05-19 NOTE — Telephone Encounter (Signed)
Patient is requesting a refill of his Xanax 1 mg - 6 times daily and                                                       Effexor XR 75 - daily Is this okay to fill?

## 2012-05-20 MED ORDER — ALPRAZOLAM 1 MG PO TABS: 1.0000 mg | ORAL_TABLET | Freq: Every day | ORAL | Status: DC

## 2012-05-20 MED ORDER — EFFEXOR XR 150 MG PO CP24: 150.0000 mg | ORAL_CAPSULE | Freq: Every day | ORAL | Status: DC

## 2012-08-19 ENCOUNTER — Other Ambulatory Visit: Payer: Self-pay | Admitting: *Deleted

## 2012-08-19 DIAGNOSIS — F4001 Agoraphobia with panic disorder: Secondary | ICD-10-CM

## 2012-08-19 MED ORDER — ALPRAZOLAM 1 MG PO TABS: 1.0000 mg | ORAL_TABLET | Freq: Every day | ORAL | Status: DC

## 2012-08-21 ENCOUNTER — Other Ambulatory Visit: Payer: Self-pay | Admitting: Family Medicine

## 2012-08-21 DIAGNOSIS — F4001 Agoraphobia with panic disorder: Secondary | ICD-10-CM

## 2012-08-21 MED ORDER — XANAX 1 MG PO TABS: 1.0000 mg | ORAL_TABLET | Freq: Every day | ORAL | Status: DC

## 2012-08-21 NOTE — Telephone Encounter (Signed)
New Rx faxed. 

## 2012-08-21 NOTE — Telephone Encounter (Signed)
Pt needs name brand for his Xanax. [facility] filled the generic brand, and he would like you to send request for the name brand.

## 2012-08-29 ENCOUNTER — Telehealth: Payer: Self-pay | Admitting: Family Medicine

## 2012-08-29 NOTE — Telephone Encounter (Signed)
Re-faxed.

## 2012-08-29 NOTE — Telephone Encounter (Signed)
Pt called and said that [facility] did not rcv script for Name Brand XANAX 1 MG tablet. Pls refax script to Fax# [phone] [facility].

## 2012-11-07 ENCOUNTER — Other Ambulatory Visit: Payer: Self-pay | Admitting: Family Medicine

## 2012-12-11 ENCOUNTER — Other Ambulatory Visit (INDEPENDENT_AMBULATORY_CARE_PROVIDER_SITE_OTHER): Payer: BC Managed Care – PPO

## 2012-12-11 DIAGNOSIS — Z0000 Encounter for general adult medical examination without abnormal findings: Secondary | ICD-10-CM

## 2012-12-11 LAB — BASIC METABOLIC PANEL
BUN: 17 mg/dL (ref 6–23)
Chloride: 103 mEq/L (ref 96–112)
GFR: 79.42 mL/min (ref >60.00)
Potassium: 4.3 mEq/L (ref 3.5–5.1)
Sodium: 140 mEq/L (ref 135–145)

## 2012-12-11 LAB — POCT URINALYSIS DIPSTICK
Glucose, UA: NEGATIVE
Nitrite, UA: NEGATIVE
Protein, UA: NEGATIVE
Spec Grav, UA: 1.02
Urobilinogen, UA: 0.2

## 2012-12-11 LAB — CBC WITH DIFFERENTIAL/PLATELET
Basophils Relative: 0.2 % (ref 0.0–3.0)
Eosinophils Relative: 2.6 % (ref 0.0–5.0)
HCT: 42.4 % (ref 39.0–52.0)
Hemoglobin: 14.4 g/dL (ref 13.0–17.0)
Lymphs Abs: 1.2 10*3/uL (ref 0.7–4.0)
MCV: 89.3 fl (ref 78.0–100.0)
Monocytes Absolute: 0.4 10*3/uL (ref 0.1–1.0)
Monocytes Relative: 6.2 % (ref 3.0–12.0)
Neutro Abs: 4.3 10*3/uL (ref 1.4–7.7)
RBC: 4.74 Mil/uL (ref 4.22–5.81)
WBC: 6.1 10*3/uL (ref 4.5–10.5)

## 2012-12-11 LAB — HEPATIC FUNCTION PANEL
AST: 22 U/L (ref 0–37)
Albumin: 3.7 g/dL (ref 3.5–5.2)
Alkaline Phosphatase: 43 U/L (ref 39–117)
Total Protein: 6.7 g/dL (ref 6.0–8.3)

## 2012-12-11 LAB — LIPID PANEL
Cholesterol: 176 mg/dL (ref 0–200)
LDL Cholesterol: 119 mg/dL — ABNORMAL HIGH (ref 0–99)
Total CHOL/HDL Ratio: 5
VLDL: 19.6 mg/dL (ref 0.0–40.0)

## 2012-12-11 LAB — PSA: PSA: 0.79 ng/mL (ref 0.10–4.00)

## 2012-12-18 ENCOUNTER — Encounter: Payer: BC Managed Care – PPO | Admitting: Family Medicine

## 2012-12-18 ENCOUNTER — Encounter: Payer: Self-pay | Admitting: Family

## 2012-12-18 ENCOUNTER — Ambulatory Visit (INDEPENDENT_AMBULATORY_CARE_PROVIDER_SITE_OTHER): Payer: BC Managed Care – PPO | Admitting: Family

## 2012-12-18 VITALS — BP 120/80 | HR 73 | Ht 73.0 in | Wt 230.0 lb

## 2012-12-18 DIAGNOSIS — M1710 Unilateral primary osteoarthritis, unspecified knee: Secondary | ICD-10-CM

## 2012-12-18 DIAGNOSIS — Z0000 Encounter for general adult medical examination without abnormal findings: Secondary | ICD-10-CM

## 2012-12-18 MED ORDER — OMEGA-3-ACID ETHYL ESTERS 1 G PO CAPS: 1.0000 g | ORAL_CAPSULE | Freq: Two times a day (BID) | ORAL | Status: DC

## 2012-12-18 NOTE — Progress Notes (Signed)
Subjective:    Patient ID: Logan [name], male    DOB: [birth year], 52 y.o.   MRN: [id number]  HPI  52 year old patient of Dr. Tawanna Cooler,  presents for yearly preventative medicine examination. All immunizations and health maintenance protocols were reviewed with the patient and they are up to date with these protocols. Screening laboratory values were reviewed with the patient including screening of hyperlipidemia PSA renal function and hepatic function. There medications past medical history social history problem list and allergies were reviewed in detail.  Goals were established with regard to weight loss exercise diet in compliance with medications   Review of Systems  Constitutional: Negative.   HENT: Negative.   Eyes: Negative.   Respiratory: Negative.   Cardiovascular: Negative.   Gastrointestinal: Negative.   Endocrine: Negative.   Genitourinary: Negative.   Musculoskeletal: Negative.   Skin: Negative.   Allergic/Immunologic: Negative.   Neurological: Negative.   Hematological: Negative.   Psychiatric/Behavioral: Negative.    Past Medical History  Diagnosis Date  . Osteoarthritis (arthritis due to wear and tear of joints)   . Panic disorder     History   Social History  . Marital Status: Married    Spouse Name: N/A    Number of Children: N/A  . Years of Education: N/A   Occupational History  . Not on file.   Social History Main Topics  . Smoking status: Never Smoker   . Smokeless tobacco: Never Used  . Alcohol Use: 0.0 oz/week    4 drink(s) per week  . Drug Use: No  . Sexually Active: Not on file   Other Topics Concern  . Not on file   Social History Narrative  . No narrative on file    Past Surgical History  Procedure Laterality Date  . Knee surgery      arthroscopic /Bil  . Rotator cuff repair      Bil  . Tonsillectomy      Family History  Problem Relation Age of Onset  . Colon cancer Mother   . Heart disease Father     Allergies   Allergen Reactions  . Codeine     Current Outpatient Prescriptions on File Prior to Visit  Medication Sig Dispense Refill  . calcium citrate-vitamin D (CITRACAL+D) 315-200 MG-UNIT per tablet Take 1 tablet by mouth daily.        . celecoxib (CELEBREX) 200 MG capsule Take 200 mg by mouth as needed.        Marland Kitchen CIALIS 20 MG tablet as needed.      . Multiple Vitamin (MULTIVITAMIN) tablet Take 1 tablet by mouth daily. Vemma Take 2 ozs, Daily       . TENORMIN 50 MG tablet TAKE 1 TABLET DAILY  100 tablet  2  . XANAX 1 MG tablet Take 1 tablet (1 mg total) by mouth 6 (six) times daily. Brand name only  540 tablet  0  . EFFEXOR XR 150 MG 24 hr capsule Take 1 capsule (150 mg total) by mouth daily.  100 capsule  3   Current Facility-Administered Medications on File Prior to Visit  Medication Dose Route Frequency Provider Last Rate Last Dose  . 0.9 %  sodium chloride infusion  500 mL Intravenous Continuous Meryl Dare, MD        BP 120/80  Pulse 73  Ht 6\' 1"  (1.854 m)  Wt 230 lb (104.327 kg)  BMI 30.35 kg/m2  SpO2 98%chart    Objective:  Physical Exam  Constitutional: He is oriented to person, place, and time. He appears well-developed and well-nourished.  HENT:  Head: Normocephalic and atraumatic.  Right Ear: External ear normal.  Left Ear: External ear normal.  Nose: Nose normal.  Mouth/Throat: Oropharynx is clear and moist.  Eyes: Conjunctivae and EOM are normal. Pupils are equal, round, and reactive to light.  Neck: Normal range of motion. Neck supple. No thyromegaly present.  Cardiovascular: Normal rate, regular rhythm and normal heart sounds.  Exam reveals no gallop and no friction rub.   No murmur heard. Pulmonary/Chest: Effort normal and breath sounds normal.  Abdominal: Soft. Bowel sounds are normal. There is no tenderness. There is no rebound and no guarding.  Genitourinary: Rectum normal, prostate normal and penis normal. Guaiac negative stool. No penile tenderness.   Musculoskeletal: Normal range of motion.  Neurological: He is alert and oriented to person, place, and time. He has normal reflexes.  Skin: Skin is warm and dry.  Psychiatric: He has a normal mood and affect.          Assessment & Plan:  Assessment:  1. Complete physical exam 2. Erectile dysfunction 3. Anxiety 4. Osteoarthritis  Plan: Encouraged healthy diet, exercise, low-cholesterol diet. Continue current medications. We'll follow with patient in one year and sooner as needed.

## 2012-12-18 NOTE — Patient Instructions (Addendum)
Exercise to Stay Healthy Exercise helps you become and stay healthy. EXERCISE IDEAS AND TIPS Choose exercises that:  You enjoy.  Fit into your day. You do not need to exercise really hard to be healthy. You can do exercises at a slow or medium level and stay healthy. You can:  Stretch before and after working out.  Try yoga, Pilates, or tai chi.  Lift weights.  Walk fast, swim, jog, run, climb stairs, bicycle, dance, or rollerskate.  Take aerobic classes. Exercises that burn about 150 calories:  Running 1  miles in 15 minutes.  Playing volleyball for 45 to 60 minutes.  Washing and waxing a car for 45 to 60 minutes.  Playing touch football for 45 minutes.  Walking 1  miles in 35 minutes.  Pushing a stroller 1  miles in 30 minutes.  Playing basketball for 30 minutes.  Raking leaves for 30 minutes.  Bicycling 5 miles in 30 minutes.  Walking 2 miles in 30 minutes.  Dancing for 30 minutes.  Shoveling snow for 15 minutes.  Swimming laps for 20 minutes.  Walking up stairs for 15 minutes.  Bicycling 4 miles in 15 minutes.  Gardening for 30 to 45 minutes.  Jumping rope for 15 minutes.  Washing windows or floors for 45 to 60 minutes. Document Released: 11/03/2010 Document Revised: 12/24/2011 Document Reviewed: 11/03/2010 [employer] Patient Information 2013 [employer].  

## 2013-02-24 ENCOUNTER — Other Ambulatory Visit: Payer: Self-pay | Admitting: Family

## 2013-04-19 ENCOUNTER — Other Ambulatory Visit: Payer: Self-pay | Admitting: Family Medicine

## 2013-04-27 ENCOUNTER — Other Ambulatory Visit: Payer: Self-pay | Admitting: *Deleted

## 2013-04-27 DIAGNOSIS — F4001 Agoraphobia with panic disorder: Secondary | ICD-10-CM

## 2013-04-27 MED ORDER — EFFEXOR XR 75 MG PO CP24: 75.0000 mg | ORAL_CAPSULE | Freq: Every day | ORAL | Status: DC

## 2013-04-27 MED ORDER — XANAX 1 MG PO TABS: 1.0000 mg | ORAL_TABLET | Freq: Every day | ORAL | Status: DC

## 2013-04-29 MED ORDER — XANAX 1 MG PO TABS: 1.0000 mg | ORAL_TABLET | Freq: Every day | ORAL | Status: DC

## 2013-07-12 ENCOUNTER — Other Ambulatory Visit: Payer: Self-pay | Admitting: Family Medicine

## 2013-09-29 ENCOUNTER — Telehealth: Payer: Self-pay | Admitting: Family Medicine

## 2013-09-29 DIAGNOSIS — F4001 Agoraphobia with panic disorder: Secondary | ICD-10-CM

## 2013-09-29 MED ORDER — XANAX 1 MG PO TABS: 1.0000 mg | ORAL_TABLET | Freq: Every day | ORAL | Status: DC

## 2013-09-29 NOTE — Telephone Encounter (Signed)
Rx faxed

## 2013-09-29 NOTE — Telephone Encounter (Signed)
Pt need refill on xanax 1 mg #540 for 90 day supply. [facility]

## 2013-11-11 ENCOUNTER — Ambulatory Visit (INDEPENDENT_AMBULATORY_CARE_PROVIDER_SITE_OTHER): Payer: Managed Care, Other (non HMO) | Admitting: Family Medicine

## 2013-11-11 ENCOUNTER — Telehealth: Payer: Self-pay | Admitting: Family Medicine

## 2013-11-11 ENCOUNTER — Encounter: Payer: Self-pay | Admitting: Family Medicine

## 2013-11-11 VITALS — BP 120/79 | HR 82 | Temp 98.6°F | Resp 18 | Ht 73.0 in | Wt 236.0 lb

## 2013-11-11 DIAGNOSIS — R69 Illness, unspecified: Principal | ICD-10-CM

## 2013-11-11 DIAGNOSIS — J111 Influenza due to unidentified influenza virus with other respiratory manifestations: Secondary | ICD-10-CM

## 2013-11-11 MED ORDER — OSELTAMIVIR PHOSPHATE 75 MG PO CAPS: 75.0000 mg | ORAL_CAPSULE | Freq: Two times a day (BID) | ORAL | Status: DC

## 2013-11-11 MED ORDER — HYDROCODONE-HOMATROPINE 5-1.5 MG/5ML PO SYRP: ORAL_SOLUTION | ORAL | Status: DC

## 2013-11-11 NOTE — Progress Notes (Signed)
Pre visit review using our clinic review tool, if applicable. No additional management support is needed unless otherwise documented below in the visit note. 

## 2013-11-11 NOTE — Telephone Encounter (Signed)
Pt has chest congestion, sore throat, headache, productive cough. Would like appt in the am. Is it ok to schedule at your 8:15 in the morning?

## 2013-11-11 NOTE — Telephone Encounter (Signed)
Got him to go to [facility] today!  Thanks anyway!!

## 2013-11-11 NOTE — Telephone Encounter (Signed)
Noted  

## 2013-11-11 NOTE — Telephone Encounter (Signed)
You can put him in the 11:30 slot. Padonda does not want her 30 min slots split

## 2013-11-11 NOTE — Progress Notes (Signed)
OFFICE NOTE  11/11/2013  CC:  Chief Complaint  Patient presents with  . URI  . Cough    productive  . Nasal Congestion  . Headache     HPI: Patient is a 53 y.o. Caucasian Logan [name] who is here for cough. Onset of nasal cong/runny nose 2 d/a, then last night got subjective fever/chills, felt very achy all over, cough that feels deep and productive, ST, HA.  No n/v/d.  No rash.  Fatigue+.  Cough worse hs.  No wheezing. Took cold med with delsym and acetaminophen in it.  Allegra and nasacort taken as well.  Pertinent PMH:  No hx of tobacco abuse No hx of asthma  MEDS:  Outpatient Prescriptions Prior to Visit  Medication Sig Dispense Refill  . calcium citrate-vitamin D (CITRACAL+D) 315-200 MG-UNIT per tablet Take 1 tablet by mouth daily.        . celecoxib (CELEBREX) 200 MG capsule Take 200 mg by mouth as needed.        Marland Kitchen LOVAZA 1 G capsule TAKE 1 CAPSULE TWICE A DAY  180 capsule  3  . Multiple Vitamin (MULTIVITAMIN) tablet Take 1 tablet by mouth daily. Vemma Take 2 ozs, Daily       . TENORMIN 50 MG tablet TAKE 1 TABLET DAILY  100 tablet  1  . XANAX 1 MG tablet Take 1 tablet (1 mg total) by mouth 6 (six) times daily. Brand name only  540 tablet  0  . CIALIS 20 MG tablet as needed.      . EFFEXOR XR 150 MG 24 hr capsule Take 1 capsule (150 mg total) by mouth daily.  100 capsule  3  . EFFEXOR XR 75 MG 24 hr capsule Take 1 capsule (75 mg total) by mouth daily.  90 capsule  3   Facility-Administered Medications Prior to Visit  Medication Dose Route Frequency Provider Last Rate Last Dose  . 0.9 %  sodium chloride infusion  500 mL Intravenous Continuous Ladene Artist, MD        PE: Blood pressure 120/79, pulse 82, temperature 98.6 F (37 C), temperature source Temporal, resp. rate 18, height 6\' 1"  (1.854 m), weight 236 lb (107.049 kg), SpO2 99.00%. VS: noted--normal. Gen: alert, NAD, NONTOXIC APPEARING. HEENT: eyes without injection, drainage, or swelling.  Ears: EACs clear, TMs with  normal light reflex and landmarks.  Nose: Clear rhinorrhea, with some dried, crusty exudate adherent to mildly injected mucosa.  No purulent d/c.  No paranasal sinus TTP.  No facial swelling.  Throat and mouth without focal lesion.  No pharyngial swelling, erythema, or exudate.   Neck: supple, no LAD.   LUNGS: CTA bilat, nonlabored resps.   CV: RRR, no m/r/g. EXT: no c/c/e SKIN: no rash  LAB: none  IMPRESSION AND PLAN:  Influenza-like illness. Discussed treatment options. Decided on tamiflu 75mg  bid x 5d. Hycodan susp 1-2 tsp qhs prn, #120 ml. Signs/symptoms to call or return for were reviewed and pt expressed understanding.  An After Visit Summary was printed and given to the patient.  FOLLOW UP: prn

## 2013-12-14 ENCOUNTER — Other Ambulatory Visit (INDEPENDENT_AMBULATORY_CARE_PROVIDER_SITE_OTHER): Payer: Managed Care, Other (non HMO)

## 2013-12-14 DIAGNOSIS — Z0000 Encounter for general adult medical examination without abnormal findings: Secondary | ICD-10-CM

## 2013-12-14 LAB — BASIC METABOLIC PANEL
BUN: 21 mg/dL (ref 6–23)
CO2: 30 mEq/L (ref 19–32)
Calcium: 9.6 mg/dL (ref 8.4–10.5)
Chloride: 103 mEq/L (ref 96–112)
Creatinine, Ser: 1.1 mg/dL (ref 0.4–1.5)
GFR: 77.4 mL/min (ref >60.00)
Glucose, Bld: 104 mg/dL — ABNORMAL HIGH (ref 70–99)
POTASSIUM: 4.7 meq/L (ref 3.5–5.1)
SODIUM: 138 meq/L (ref 135–145)

## 2013-12-14 LAB — CBC WITH DIFFERENTIAL/PLATELET
BASOS ABS: 0 10*3/uL (ref 0.0–0.1)
BASOS PCT: 0.3 % (ref 0.0–3.0)
EOS PCT: 1.9 % (ref 0.0–5.0)
Eosinophils Absolute: 0.1 10*3/uL (ref 0.0–0.7)
HEMATOCRIT: 47.4 % (ref 39.0–52.0)
HEMOGLOBIN: 15.6 g/dL (ref 13.0–17.0)
LYMPHS PCT: 26.9 % (ref 12.0–46.0)
Lymphs Abs: 1.7 10*3/uL (ref 0.7–4.0)
MCHC: 32.8 g/dL (ref 30.0–36.0)
MCV: 91.7 fl (ref 78.0–100.0)
Monocytes Absolute: 0.5 10*3/uL (ref 0.1–1.0)
Monocytes Relative: 8.7 % (ref 3.0–12.0)
NEUTROS ABS: 3.9 10*3/uL (ref 1.4–7.7)
Neutrophils Relative %: 62.2 % (ref 43.0–77.0)
Platelets: 159 10*3/uL (ref 150.0–400.0)
RBC: 5.17 Mil/uL (ref 4.22–5.81)
RDW: 13.6 % (ref 11.5–14.6)
WBC: 6.3 10*3/uL (ref 4.5–10.5)

## 2013-12-14 LAB — LIPID PANEL
CHOL/HDL RATIO: 5
CHOLESTEROL: 204 mg/dL — AB (ref 0–200)
HDL: 44.6 mg/dL (ref >39.00)
LDL CALC: 135 mg/dL — AB (ref 0–99)
Triglycerides: 121 mg/dL (ref 0.0–149.0)
VLDL: 24.2 mg/dL (ref 0.0–40.0)

## 2013-12-14 LAB — POCT URINALYSIS DIPSTICK
Bilirubin, UA: NEGATIVE
Blood, UA: NEGATIVE
Glucose, UA: NEGATIVE
KETONES UA: NEGATIVE
Leukocytes, UA: NEGATIVE
Nitrite, UA: NEGATIVE
Protein, UA: NEGATIVE
SPEC GRAV UA: 1.02
Urobilinogen, UA: 0.2
pH, UA: 6.5

## 2013-12-14 LAB — HEPATIC FUNCTION PANEL
ALK PHOS: 51 U/L (ref 39–117)
ALT: 30 U/L (ref 0–53)
AST: 27 U/L (ref 0–37)
Albumin: 4.2 g/dL (ref 3.5–5.2)
Bilirubin, Direct: 0.1 mg/dL (ref 0.0–0.3)
TOTAL PROTEIN: 7.6 g/dL (ref 6.0–8.3)
Total Bilirubin: 1.3 mg/dL — ABNORMAL HIGH (ref 0.3–1.2)

## 2013-12-14 LAB — PSA: PSA: 0.86 ng/mL (ref 0.10–4.00)

## 2013-12-14 LAB — TSH: TSH: 2.23 u[IU]/mL (ref 0.35–5.50)

## 2013-12-17 ENCOUNTER — Other Ambulatory Visit: Payer: Self-pay | Admitting: Family Medicine

## 2013-12-21 ENCOUNTER — Encounter: Payer: BC Managed Care – PPO | Admitting: Family Medicine

## 2013-12-30 ENCOUNTER — Telehealth: Payer: Self-pay | Admitting: Family Medicine

## 2013-12-30 DIAGNOSIS — F4001 Agoraphobia with panic disorder: Secondary | ICD-10-CM

## 2013-12-30 MED ORDER — XANAX 1 MG PO TABS: 1.0000 mg | ORAL_TABLET | Freq: Every day | ORAL | Status: DC

## 2013-12-30 MED ORDER — TENORMIN 50 MG PO TABS
ORAL_TABLET | ORAL | Status: DC
Start: 2013-12-30 — End: 2014-01-05

## 2013-12-30 NOTE — Telephone Encounter (Addendum)
Pt needs new rx xanax #540  brand name only [facility] pt id #[phone]. Pt also need refill on tenormin 50 mg (name brand) #90 sent to [facility]

## 2014-01-05 ENCOUNTER — Encounter: Payer: Self-pay | Admitting: Family Medicine

## 2014-01-05 ENCOUNTER — Ambulatory Visit (INDEPENDENT_AMBULATORY_CARE_PROVIDER_SITE_OTHER): Payer: Managed Care, Other (non HMO) | Admitting: Family Medicine

## 2014-01-05 VITALS — BP 110/80 | Temp 97.6°F | Ht 73.0 in | Wt 234.0 lb

## 2014-01-05 DIAGNOSIS — M25561 Pain in right knee: Secondary | ICD-10-CM

## 2014-01-05 DIAGNOSIS — Z86010 Personal history of colonic polyps: Secondary | ICD-10-CM

## 2014-01-05 DIAGNOSIS — M25569 Pain in unspecified knee: Secondary | ICD-10-CM

## 2014-01-05 DIAGNOSIS — Z0000 Encounter for general adult medical examination without abnormal findings: Secondary | ICD-10-CM

## 2014-01-05 DIAGNOSIS — F4001 Agoraphobia with panic disorder: Secondary | ICD-10-CM

## 2014-01-05 DIAGNOSIS — M25562 Pain in left knee: Secondary | ICD-10-CM

## 2014-01-05 DIAGNOSIS — J309 Allergic rhinitis, unspecified: Secondary | ICD-10-CM

## 2014-01-05 MED ORDER — LOVAZA 1 G PO CAPS: 1.0000 g | ORAL_CAPSULE | Freq: Two times a day (BID) | ORAL | Status: DC

## 2014-01-05 MED ORDER — TENORMIN 50 MG PO TABS: ORAL_TABLET | ORAL | Status: DC

## 2014-01-05 MED ORDER — OMEGA-3-ACID ETHYL ESTERS 1 G PO CAPS: ORAL_CAPSULE | ORAL | Status: DC

## 2014-01-05 NOTE — Progress Notes (Signed)
   Subjective:    Patient ID: Logan [name], male    DOB: 1961-07-06, 53 y.o.   MRN: [id number]  HPI Carnelius is a 53 year old married male nonsmoker who comes in today for general physical examination  He has a history of panic disorder and takes Xanax 1 mg ,,,,, 6 times daily. This was prescribed by his psychiatrist previously and we have continue to write his medication.  He takes Aleve twice a day for chronic knee pain. The next step in the treatment for his knees is a total knee replacement  He takes Tenormin 50 mg daily to help with panic  He gets routine eye care, dental care, has had 2 colonoscopies both of which showed polyps.  Dr. Berenice Primas his orthopedist.  Vaccinations up-to-date   Review of Systems  Constitutional: Negative.   HENT: Negative.   Eyes: Negative.   Respiratory: Negative.   Cardiovascular: Negative.   Gastrointestinal: Negative.   Genitourinary: Negative.   Musculoskeletal: Negative.   Skin: Negative.   Neurological: Negative.   Psychiatric/Behavioral: Negative.        Objective:   Physical Exam  Constitutional: He is oriented to person, place, and time. He appears well-developed and well-nourished.  HENT:  Head: Normocephalic and atraumatic.  Right Ear: External ear normal.  Left Ear: External ear normal.  Nose: Nose normal.  Mouth/Throat: Oropharynx is clear and moist.  Eyes: Conjunctivae and EOM are normal. Pupils are equal, round, and reactive to light.  Neck: Normal range of motion. Neck supple. No JVD present. No tracheal deviation present. No thyromegaly present.  Cardiovascular: Normal rate, regular rhythm, normal heart sounds and intact distal pulses.  Exam reveals no gallop and no friction rub.   No murmur heard. Pulmonary/Chest: Effort normal and breath sounds normal. No stridor. No respiratory distress. He has no wheezes. He has no rales. He exhibits no tenderness.  Abdominal: Soft. Bowel sounds are normal. He exhibits no  distension and no mass. There is no tenderness. There is no rebound and no guarding.  Genitourinary: Rectum normal, prostate normal and penis normal. Guaiac negative stool. No penile tenderness.  Musculoskeletal: Normal range of motion. He exhibits no edema and no tenderness.  Lymphadenopathy:    He has no cervical adenopathy.  Neurological: He is alert and oriented to person, place, and time. He has normal reflexes. No cranial nerve deficit. He exhibits normal muscle tone.  Skin: Skin is warm and dry. No rash noted. No erythema. No pallor.  Psychiatric: He has a normal mood and affect. His behavior is normal. Judgment and thought content normal.          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 as prescribed by his psychiatrist

## 2014-01-05 NOTE — Patient Instructions (Signed)
Continue your current medications  Going forward he may want to consider consulting with Dr. Letta Moynahan for y Xanax.  Return in one year for general physical examination sooner if any problem

## 2014-01-26 ENCOUNTER — Other Ambulatory Visit: Payer: Self-pay | Admitting: Family Medicine

## 2014-03-25 ENCOUNTER — Other Ambulatory Visit: Payer: Self-pay | Admitting: Family Medicine

## 2014-03-30 ENCOUNTER — Telehealth: Payer: Self-pay | Admitting: Family Medicine

## 2014-03-30 DIAGNOSIS — F4001 Agoraphobia with panic disorder: Secondary | ICD-10-CM

## 2014-03-30 MED ORDER — XANAX 1 MG PO TABS: 1.0000 mg | ORAL_TABLET | Freq: Every day | ORAL | Status: DC

## 2014-03-30 NOTE — Telephone Encounter (Signed)
Rx faxed and Left message on machine for patient 

## 2014-03-30 NOTE — Telephone Encounter (Signed)
Pt request refill of XANAX 1 MG tablet [facility]  90 day   #540 Pt states he wants name brand, "dispenxe as written"

## 2014-06-28 ENCOUNTER — Telehealth: Payer: Self-pay | Admitting: Family Medicine

## 2014-06-28 DIAGNOSIS — F4001 Agoraphobia with panic disorder: Secondary | ICD-10-CM

## 2014-06-28 NOTE — Telephone Encounter (Signed)
Patient needs refills on his Xanax.  He also wants the name bran only.

## 2014-06-29 MED ORDER — XANAX 1 MG PO TABS: 1.0000 mg | ORAL_TABLET | Freq: Every day | ORAL | Status: DC

## 2014-06-29 NOTE — Telephone Encounter (Signed)
Rx faxed and confirmed.  Spoke with patient and he is informed that his prescriptions should be filled by a new provider in January.

## 2014-09-13 ENCOUNTER — Telehealth: Payer: Self-pay | Admitting: Family Medicine

## 2014-09-13 DIAGNOSIS — F4001 Agoraphobia with panic disorder: Secondary | ICD-10-CM

## 2014-09-13 MED ORDER — XANAX 1 MG PO TABS: 1.0000 mg | ORAL_TABLET | Freq: Every day | ORAL | Status: DC

## 2014-09-13 NOTE — Telephone Encounter (Signed)
Needs a refill on xanax (name brand only) and effexor xr 75mg  (name brand only) please.

## 2014-09-14 NOTE — Telephone Encounter (Signed)
Rx sent to pharmacy   

## 2014-09-16 NOTE — Telephone Encounter (Signed)
Hard copy faxed 09/14/14.

## 2014-09-16 NOTE — Telephone Encounter (Signed)
Can you please re-send the below rx??  [facility]

## 2014-09-24 ENCOUNTER — Telehealth: Payer: Self-pay | Admitting: Family Medicine

## 2014-09-24 NOTE — Telephone Encounter (Signed)
Per [name], refill will have to wait until Monday for [name]'s approval

## 2014-09-24 NOTE — Telephone Encounter (Addendum)
[phone] effexor #90 w/refills name brand only . Pt has cpx sch in April 2016

## 2014-09-27 MED ORDER — VENLAFAXINE HCL ER 150 MG PO CP24: 150.0000 mg | ORAL_CAPSULE | Freq: Every day | ORAL | Status: DC

## 2014-09-27 MED ORDER — EFFEXOR XR 150 MG PO CP24: 150.0000 mg | ORAL_CAPSULE | Freq: Every day | ORAL | Status: DC

## 2014-09-27 NOTE — Telephone Encounter (Signed)
[facility]. [facility] 2. Rx [facility]ed [facility]

## 2014-12-20 ENCOUNTER — Telehealth: Payer: Self-pay | Admitting: Family Medicine

## 2014-12-20 DIAGNOSIS — F4001 Agoraphobia with panic disorder: Secondary | ICD-10-CM

## 2014-12-20 NOTE — Telephone Encounter (Signed)
Pt request refill of the following: XANAX 1 MG tablet,  EFFEXOR XR 150 MG 24 hr capsule   Pt said he thinks he only need the 75mg  extended release of the EFFEXOR  PT SAID HE WOULD LIKE GENERIC FOR BOTH OF THE ABOVE MEDS  [facility] [address] [place]

## 2014-12-21 ENCOUNTER — Other Ambulatory Visit: Payer: Self-pay | Admitting: Family Medicine

## 2014-12-22 ENCOUNTER — Telehealth: Payer: Self-pay | Admitting: Family Medicine

## 2014-12-22 MED ORDER — EFFEXOR XR 150 MG PO CP24: 150.0000 mg | ORAL_CAPSULE | Freq: Every day | ORAL | Status: DC

## 2014-12-22 MED ORDER — VENLAFAXINE HCL ER 75 MG PO CP24: 75.0000 mg | ORAL_CAPSULE | Freq: Every day | ORAL | Status: DC

## 2014-12-22 NOTE — Telephone Encounter (Signed)
[facility] EFFEXOR XR 150 MG 24 hr capsule , but that is too much. Pt needs EFFEXOR XR 75 MG 24 hr capsule  Pt did not get the rx for xanax

## 2014-12-22 NOTE — Telephone Encounter (Signed)
Effexor sent to pharmacy.  xananx will no longer be filled by Dr Sherren Mocha per Dr Sherren Mocha.  Note sent to pharmacy.

## 2014-12-22 NOTE — Telephone Encounter (Signed)
New Rx for Effexor has been sent.  Rx for Xanax was denied per Dr Sherren Mocha.  Patient was informed that Dr Sherren Mocha would no longer be filling his xanax.

## 2014-12-22 NOTE — Telephone Encounter (Signed)
Pt would like to know why xanax will not be filled?  Pt will be out in the am. Pt would at least like enough to get him through another dr- a month supply 180 tabs, per pt.

## 2014-12-22 NOTE — Telephone Encounter (Signed)
Pt needs refill on xanax 1 mg #540 and pt would like effexor xr 75 mg #90 both w/[facility]ills [facility] [place]. Pt is out of xanax

## 2014-12-23 ENCOUNTER — Telehealth: Payer: Self-pay | Admitting: Family Medicine

## 2014-12-23 DIAGNOSIS — F4001 Agoraphobia with panic disorder: Secondary | ICD-10-CM

## 2014-12-23 MED ORDER — XANAX 1 MG PO TABS
1.0000 mg | ORAL_TABLET | Freq: Every day | ORAL | Status: DC
Start: 2014-12-23 — End: 2015-02-23

## 2014-12-23 NOTE — Telephone Encounter (Signed)
Rx called in.  Must be last refill per Dr Sherren Mocha.

## 2014-12-23 NOTE — Telephone Encounter (Signed)
Pt called to say that you can not just stop taking  XANAX 1 MG tablet cold [place]. He said if Dr Sherren Mocha will just fill it for a month that will give him time to find another physician.

## 2015-02-01 ENCOUNTER — Encounter: Payer: Managed Care, Other (non HMO) | Admitting: Family Medicine

## 2015-02-23 ENCOUNTER — Encounter: Payer: Self-pay | Admitting: Adult Health

## 2015-02-23 ENCOUNTER — Ambulatory Visit (INDEPENDENT_AMBULATORY_CARE_PROVIDER_SITE_OTHER): Payer: PRIVATE HEALTH INSURANCE | Admitting: Adult Health

## 2015-02-23 ENCOUNTER — Other Ambulatory Visit: Payer: Self-pay | Admitting: Adult Health

## 2015-02-23 VITALS — BP 110/70 | Temp 97.6°F | Ht 73.0 in | Wt 235.3 lb

## 2015-02-23 DIAGNOSIS — Z0000 Encounter for general adult medical examination without abnormal findings: Secondary | ICD-10-CM

## 2015-02-23 DIAGNOSIS — R748 Abnormal levels of other serum enzymes: Secondary | ICD-10-CM

## 2015-02-23 DIAGNOSIS — Z125 Encounter for screening for malignant neoplasm of prostate: Secondary | ICD-10-CM

## 2015-02-23 LAB — COMPREHENSIVE METABOLIC PANEL
ALBUMIN: 4.4 g/dL (ref 3.5–5.2)
ALT: 99 U/L — AB (ref 0–53)
AST: 53 U/L — AB (ref 0–37)
Alkaline Phosphatase: 69 U/L (ref 39–117)
BUN: 18 mg/dL (ref 6–23)
CO2: 32 mEq/L (ref 19–32)
Calcium: 10 mg/dL (ref 8.4–10.5)
Chloride: 102 mEq/L (ref 96–112)
Creatinine, Ser: 1.02 mg/dL (ref 0.40–1.50)
GFR: 80.55 mL/min (ref >60.00)
Glucose, Bld: 98 mg/dL (ref 70–99)
POTASSIUM: 4.8 meq/L (ref 3.5–5.1)
SODIUM: 139 meq/L (ref 135–145)
TOTAL PROTEIN: 7.3 g/dL (ref 6.0–8.3)
Total Bilirubin: 0.6 mg/dL (ref 0.2–1.2)

## 2015-02-23 LAB — CBC WITH DIFFERENTIAL/PLATELET
Basophils Absolute: 0 10*3/uL (ref 0.0–0.1)
Basophils Relative: 0.4 % (ref 0.0–3.0)
Eosinophils Absolute: 0.1 10*3/uL (ref 0.0–0.7)
Eosinophils Relative: 2 % (ref 0.0–5.0)
HCT: 45.5 % (ref 39.0–52.0)
Hemoglobin: 15.5 g/dL (ref 13.0–17.0)
Lymphocytes Relative: 26.8 % (ref 12.0–46.0)
Lymphs Abs: 1.9 10*3/uL (ref 0.7–4.0)
MCHC: 34 g/dL (ref 30.0–36.0)
MCV: 89.8 fl (ref 78.0–100.0)
MONO ABS: 0.6 10*3/uL (ref 0.1–1.0)
Monocytes Relative: 7.7 % (ref 3.0–12.0)
NEUTROS PCT: 63.1 % (ref 43.0–77.0)
Neutro Abs: 4.6 10*3/uL (ref 1.4–7.7)
Platelets: 147 10*3/uL — ABNORMAL LOW (ref 150.0–400.0)
RBC: 5.07 Mil/uL (ref 4.22–5.81)
RDW: 13.3 % (ref 11.5–15.5)
WBC: 7.2 10*3/uL (ref 4.0–10.5)

## 2015-02-23 LAB — BASIC METABOLIC PANEL
BUN: 18 mg/dL (ref 6–23)
CALCIUM: 10 mg/dL (ref 8.4–10.5)
CO2: 32 meq/L (ref 19–32)
CREATININE: 1.02 mg/dL (ref 0.40–1.50)
Chloride: 102 mEq/L (ref 96–112)
GFR: 80.55 mL/min (ref >60.00)
GLUCOSE: 98 mg/dL (ref 70–99)
Potassium: 4.8 mEq/L (ref 3.5–5.1)
Sodium: 139 mEq/L (ref 135–145)

## 2015-02-23 LAB — LIPID PANEL
CHOL/HDL RATIO: 5
Cholesterol: 236 mg/dL — ABNORMAL HIGH (ref 0–200)
HDL: 46.7 mg/dL (ref >39.00)
LDL Cholesterol: 166 mg/dL — ABNORMAL HIGH (ref 0–99)
NonHDL: 189.3
TRIGLYCERIDES: 118 mg/dL (ref 0.0–149.0)
VLDL: 23.6 mg/dL (ref 0.0–40.0)

## 2015-02-23 LAB — TSH: TSH: 1.53 u[IU]/mL (ref 0.35–4.50)

## 2015-02-23 LAB — HEMOGLOBIN A1C: Hgb A1c MFr Bld: 5.8 % (ref 4.6–6.5)

## 2015-02-23 LAB — PSA: PSA: 0.79 ng/mL (ref 0.10–4.00)

## 2015-02-23 MED ORDER — OMEGA-3-ACID ETHYL ESTERS 1 G PO CAPS: 1.0000 | ORAL_CAPSULE | Freq: Two times a day (BID) | ORAL | Status: DC

## 2015-02-23 MED ORDER — LOVAZA 1 G PO CAPS: 1.0000 | ORAL_CAPSULE | Freq: Two times a day (BID) | ORAL | Status: DC

## 2015-02-23 MED ORDER — TENORMIN 50 MG PO TABS: 50.0000 mg | ORAL_TABLET | Freq: Every day | ORAL | Status: DC

## 2015-02-23 NOTE — Progress Notes (Signed)
Pre visit review using our clinic review tool, if applicable. No additional management support is needed unless otherwise documented below in the visit note. 

## 2015-02-23 NOTE — Patient Instructions (Addendum)
It was great meeting you today. I will inform you of your lab results when I get them back. Follow up in one year for your next physical or sooner if you need anything. Try and start incorporating more aerobic exercise into your lifestyle.    Health Maintenance A healthy lifestyle and preventative care can promote health and wellness. 1. Maintain regular health, dental, and eye exams. 2. Eat a healthy diet. Foods like vegetables, fruits, whole grains, low-fat dairy products, and lean protein foods contain the nutrients you need and are low in calories. Decrease your intake of foods high in solid fats, added sugars, and salt. Get information about a proper diet from your health care provider, if necessary. 3. Regular physical exercise is one of the most important things you can do for your health. Most adults should get at least 150 minutes of moderate-intensity exercise (any activity that increases your heart rate and causes you to sweat) each week. In addition, most adults need muscle-strengthening exercises on 2 or more days a week.  4. Maintain a healthy weight. The body mass index (BMI) is a screening tool to identify possible weight problems. It provides an estimate of body fat based on height and weight. Your health care provider can find your BMI and can help you achieve or maintain a healthy weight. For males 20 years and older: 1. A BMI below 18.5 is considered underweight. 2. A BMI of 18.5 to 24.9 is normal. 3. A BMI of 25 to 29.9 is considered overweight. 4. A BMI of 30 and above is considered obese. 5. Maintain normal blood lipids and cholesterol by exercising and minimizing your intake of saturated fat. Eat a balanced diet with plenty of fruits and vegetables. Blood tests for lipids and cholesterol should begin at age 26 and be repeated every 5 years. If your lipid or cholesterol levels are high, you are over age 55, or you are at high risk for heart disease, you may need your cholesterol  levels checked more frequently.Ongoing high lipid and cholesterol levels should be treated with medicines if diet and exercise are not working. 6. If you smoke, find out from your health care provider how to quit. If you do not use tobacco, do not start. 7. Lung cancer screening is recommended for adults aged 30-80 years who are at high risk for developing lung cancer because of a history of smoking. A yearly low-dose CT scan of the lungs is recommended for people who have at least a 30-pack-year history of smoking and are current smokers or have quit within the past 15 years. A pack year of smoking is smoking an average of 1 pack of cigarettes a day for 1 year (for example, a 30-pack-year history of smoking could mean smoking 1 pack a day for 30 years or 2 packs a day for 15 years). Yearly screening should continue until the smoker has stopped smoking for at least 15 years. Yearly screening should be stopped for people who develop a health problem that would prevent them from having lung cancer treatment. 8. If you choose to drink alcohol, do not have more than 2 drinks per day. One drink is considered to be 12 oz (360 mL) of beer, 5 oz (150 mL) of wine, or 1.5 oz (45 mL) of liquor. 9. Avoid the use of street drugs. Do not share needles with anyone. Ask for help if you need support or instructions about stopping the use of drugs. 10. High blood pressure causes heart  disease and increases the risk of stroke. Blood pressure should be checked at least every 1-2 years. Ongoing high blood pressure should be treated with medicines if weight loss and exercise are not effective. 83. If you are 84-56 years old, ask your health care provider if you should take aspirin to prevent heart disease. 12. Diabetes screening involves taking a blood sample to check your fasting blood sugar level. This should be done once every 3 years after age 50 if you are at a normal weight and without risk factors for diabetes. Testing  should be considered at a younger age or be carried out more frequently if you are overweight and have at least 1 risk factor for diabetes. 13. Colorectal cancer can be detected and often prevented. Most routine colorectal cancer screening begins at the age of 33 and continues through age 32. However, your health care provider may recommend screening at an earlier age if you have risk factors for colon cancer. On a yearly basis, your health care provider may provide home test kits to check for hidden blood in the stool. A small camera at the end of a tube may be used to directly examine the colon (sigmoidoscopy or colonoscopy) to detect the earliest forms of colorectal cancer. Talk to your health care provider about this at age 8 when routine screening begins. A direct exam of the colon should be repeated every 5-10 years through age 60, unless early forms of precancerous polyps or small growths are found. 66. People who are at an increased risk for hepatitis B should be screened for this virus. You are considered at high risk for hepatitis B if: 1. You were born in a country where hepatitis B occurs often. Talk with your health care provider about which countries are considered high risk. 2. Your parents were born in a high-risk country and you have not received a shot to protect against hepatitis B (hepatitis B vaccine). 3. You have HIV or AIDS. 4. You use needles to inject street drugs. 5. You live with, or have sex with, someone who has hepatitis B. 54. You are a man who has sex with other men (MSM). 7. You get hemodialysis treatment. 8. You take certain medicines for conditions like cancer, organ transplantation, and autoimmune conditions. 15. Hepatitis C blood testing is recommended for all people born from [birth year] through 1966 and any individual with known risk factors for hepatitis C. 16. Healthy men should no longer receive prostate-specific antigen (PSA) blood tests as part of routine cancer  screening. Talk to your health care provider about prostate cancer screening. 60. Testicular cancer screening is not recommended for adolescents or adult males who have no symptoms. Screening includes self-exam, a health care provider exam, and other screening tests. Consult with your health care provider about any symptoms you have or any concerns you have about testicular cancer. 56. Practice safe sex. Use condoms and avoid high-risk sexual practices to reduce the spread of sexually transmitted infections (STIs). 69. You should be screened for STIs, including gonorrhea and chlamydia if: 1. You are sexually active and are younger than 24 years. 2. You are older than 24 years, and your health care provider tells you that you are at risk for this type of infection. 3. Your sexual activity has changed since you were last screened, and you are at an increased risk for chlamydia or gonorrhea. Ask your health care provider if you are at risk. 20. If you are at risk of being infected  with HIV, it is recommended that you take a prescription medicine daily to prevent HIV infection. This is called pre-exposure prophylaxis (PrEP). You are considered at risk if: 1. You are a man who has sex with other men (MSM). 2. You are a heterosexual man who is sexually active with multiple partners. 3. You take drugs by injection. 4. You are sexually active with a partner who has HIV. 5. Talk with your health care provider about whether you are at high risk of being infected with HIV. If you choose to begin PrEP, you should first be tested for HIV. You should then be tested every 3 months for as long as you are taking PrEP. 21. Use sunscreen. Apply sunscreen liberally and repeatedly throughout the day. You should seek shade when your shadow is shorter than you. Protect yourself by wearing long sleeves, pants, a wide-brimmed hat, and sunglasses year round whenever you are outdoors. 38. Tell your health care provider of new  moles or changes in moles, especially if there is a change in shape or color. Also, tell your health care provider if a mole is larger than the size of a pencil eraser. 23. A one-time screening for abdominal aortic aneurysm (AAA) and surgical repair of large AAAs by ultrasound is recommended for men aged 32-75 years who are current or former smokers. 24. Stay current with your vaccines (immunizations). Document Released: 03/29/2008 Document Revised: 10/06/2013 Document Reviewed: 02/26/2011 [facility] Patient Information 2015 [place], [place]. This information is not intended to replace advice given to you by your health care provider. Make sure you discuss any questions you have with your health care provider.  American Heart Association (AHA) Exercise Recommendation  Being physically active is important to prevent heart disease and stroke, the nation's No. 1and No. 5killers. To improve overall cardiovascular health, we suggest at least 150 minutes per week of moderate exercise or 75 minutes per week of vigorous exercise (or a combination of moderate and vigorous activity). Thirty minutes a day, five times a week is an easy goal to remember. You will also experience benefits even if you divide your time into two or three segments of 10 to 15 minutes per day.  For people who would benefit from lowering their blood pressure or cholesterol, we recommend 40 minutes of aerobic exercise of moderate to vigorous intensity three to four times a week to lower the risk for heart attack and stroke.  Physical activity is anything that makes you move your body and burn calories.  This includes things like climbing stairs or playing sports. Aerobic exercises benefit your heart, and include walking, jogging, swimming or biking. Strength and stretching exercises are best for overall stamina and flexibility.  The simplest, positive change you can make to effectively improve your heart health is to start walking. It's  enjoyable, free, easy, social and great exercise. A walking program is flexible and boasts high success rates because people can stick with it. It's easy for walking to become a regular and satisfying part of life.   For Overall Cardiovascular Health:  At least 30 minutes of moderate-intensity aerobic activity at least 5 days per week for a total of 150  OR   At least 25 minutes of vigorous aerobic activity at least 3 days per week for a total of 75 minutes; or a combination of moderate- and vigorous-intensity aerobic activity  AND   Moderate- to high-intensity muscle-strengthening activity at least 2 days per week for additional health benefits.  For Lowering Blood  Pressure and Cholesterol  An average 40 minutes of moderate- to vigorous-intensity aerobic activity 3 or 4 times per week  What if I can't make it to the time goal? Something is always better than nothing! And everyone has to start somewhere. Even if you've been sedentary for years, today is the day you can begin to make healthy changes in your life. If you don't think you'll make it for 30 or 40 minutes, set a reachable goal for today. You can work up toward your overall goal by increasing your time as you get stronger. Don't let all-or-nothing thinking rob you of doing what you can every day.  Source:http://www.heart.org

## 2015-02-23 NOTE — Progress Notes (Addendum)
Patient ID: Logan [name], male   DOB: Oct 14, 1961, 54 y.o.   MRN: [id number] Patient presents for yearly preventative medicine examination.  All immunizations and health maintenance protocols were reviewed with the patient and needed orders were placed.  Appropriate screening laboratory values were ordered for the patient including screening of hyperlipidemia, renal function and hepatic function. If indicated by BPH, a PSA was ordered.The natural history of prostate cancer and ongoing controversy regarding screening and potential treatment outcomes of prostate cancer has been discussed with the patient. The meaning of a false positive PSA and a false negative PSA has been discussed. He indicates understanding of the limitations of this screening test and wishes  to proceed with screening PSA testing.  Medication reconciliation,  past medical history, social history, problem list and allergies were reviewed in detail with the patient  Goals were established with regard to weight loss, exercise, and  diet in compliance with medications  Logan [name] is a 54 year old married male nonsmoker who comes in today for general physical examination     He takes Tenormin 50 mg daily to help with panic  He gets routine eye care, dental care, has had 2 colonoscopies both of which showed polyps.He has another colonoscopy scheduled for 2017  Dr. Berenice Primas his orthopedist.  Review of Systems  Constitutional: Negative.  HENT: Negative.  Eyes: Negative.  Respiratory: Negative.  Cardiovascular: Negative.  Gastrointestinal: Negative.  Genitourinary: Negative.  Musculoskeletal: Negative.  Skin: Negative.  Neurological: Negative.  Psychiatric/Behavioral: Negative.       Objective:   Physical Exam  Constitutional: He is oriented to person, place, and time. He appears well-developed and well-nourished.  HENT:  Head: Normocephalic and atraumatic.  Right Ear: External ear normal.  Left  Ear: External ear normal.  Nose: Nose normal.  Mouth/Throat: Oropharynx is clear and moist.  Eyes: Conjunctivae and EOM are normal. Pupils are equal, round, and reactive to light.  Neck: Normal range of motion. Neck supple. No JVD present. No tracheal deviation present. No thyromegaly present.  Cardiovascular: Normal rate, regular rhythm, normal heart sounds and intact distal pulses. Exam reveals no gallop and no friction rub.  No murmur heard. Pulmonary/Chest: Effort normal and breath sounds normal. No stridor. No respiratory distress. He has no wheezes. He has no rales. He exhibits no tenderness.  Abdominal: Soft. Bowel sounds are normal. He exhibits no distension and no mass. There is no tenderness. There is no rebound and no guarding.  Musculoskeletal: Normal range of motion. He exhibits no edema and no tenderness.  Lymphadenopathy:   He has no cervical adenopathy.  Neurological: He is alert and oriented to person, place, and time. He has normal reflexes. No cranial nerve deficit. He exhibits normal muscle tone.  Skin: Skin is warm and dry. No rash noted. No erythema. No pallor.  Psychiatric: He has a normal mood and affect. His behavior is normal. Judgment and thought content normal.         Assessment & Plan:  1. Routine general medical examination at a health care facility Basic metabolic panel - CBC with Differential - Comprehensive metabolic panel - Hemoglobin A1c - Lipid panel - PSA(Must document that pt has been informed of limitations of PSA testing.) - TSH    History of panic attacks Tenormin 50 mg daily  Follow up in one year for CPE Follow up sooner if needed -Incorporate a healthy diet and exercise into your life Prescriptions for Tenormin and Lovaza given to patient.

## 2015-03-24 ENCOUNTER — Encounter: Payer: Self-pay | Admitting: Adult Health

## 2015-06-17 ENCOUNTER — Encounter: Payer: Self-pay | Admitting: Adult Health

## 2015-07-25 ENCOUNTER — Encounter: Payer: Managed Care, Other (non HMO) | Admitting: Family Medicine

## 2015-09-28 ENCOUNTER — Encounter: Payer: Self-pay | Admitting: Gastroenterology

## 2015-11-08 ENCOUNTER — Encounter: Payer: Self-pay | Admitting: Adult Health

## 2015-11-09 MED ORDER — TENORMIN 50 MG PO TABS: 50.0000 mg | ORAL_TABLET | Freq: Two times a day (BID) | ORAL | Status: AC

## 2015-11-09 NOTE — Telephone Encounter (Signed)
Rx sent to pharmacy   

## 2015-12-22 ENCOUNTER — Encounter: Payer: Self-pay | Admitting: Gastroenterology

## 2016-01-23 ENCOUNTER — Encounter: Payer: Self-pay | Admitting: Gastroenterology

## 2016-03-08 ENCOUNTER — Ambulatory Visit (AMBULATORY_SURGERY_CENTER): Payer: Self-pay

## 2016-03-08 VITALS — Ht 73.0 in | Wt 241.2 lb

## 2016-03-08 DIAGNOSIS — Z860100 Personal history of colon polyps, unspecified: Secondary | ICD-10-CM

## 2016-03-08 MED ORDER — SUPREP BOWEL PREP KIT 17.5-3.13-1.6 GM/177ML PO SOLN: 1.0000 | Freq: Once | ORAL | Status: DC

## 2016-03-08 NOTE — Progress Notes (Signed)
No allergies to eggs or soy No past problems with anesthesia No home oxygen No diet meds  Has email and internet; declined emmi 

## 2016-03-14 ENCOUNTER — Encounter: Payer: Self-pay | Admitting: Gastroenterology

## 2016-03-20 ENCOUNTER — Encounter: Payer: Self-pay | Admitting: Gastroenterology

## 2016-03-22 ENCOUNTER — Encounter: Payer: Self-pay | Admitting: Gastroenterology

## 2016-03-22 ENCOUNTER — Ambulatory Visit (AMBULATORY_SURGERY_CENTER): Payer: 59 | Admitting: Gastroenterology

## 2016-03-22 VITALS — BP 102/66 | HR 50 | Temp 97.3°F | Resp 13 | Ht 73.0 in | Wt 241.0 lb

## 2016-03-22 DIAGNOSIS — D125 Benign neoplasm of sigmoid colon: Secondary | ICD-10-CM

## 2016-03-22 DIAGNOSIS — K253 Acute gastric ulcer without hemorrhage or perforation: Secondary | ICD-10-CM

## 2016-03-22 DIAGNOSIS — Z86018 Personal history of other benign neoplasm: Secondary | ICD-10-CM

## 2016-03-22 DIAGNOSIS — K317 Polyp of stomach and duodenum: Secondary | ICD-10-CM

## 2016-03-22 DIAGNOSIS — Z8719 Personal history of other diseases of the digestive system: Secondary | ICD-10-CM

## 2016-03-22 DIAGNOSIS — Z86010 Personal history of colonic polyps: Secondary | ICD-10-CM

## 2016-03-22 DIAGNOSIS — Z800 Family history of malignant neoplasm of digestive organs: Secondary | ICD-10-CM | POA: Diagnosis not present

## 2016-03-22 DIAGNOSIS — K2950 Unspecified chronic gastritis without bleeding: Secondary | ICD-10-CM | POA: Diagnosis not present

## 2016-03-22 DIAGNOSIS — K259 Gastric ulcer, unspecified as acute or chronic, without hemorrhage or perforation: Secondary | ICD-10-CM

## 2016-03-22 MED ORDER — SODIUM CHLORIDE 0.9 % IV SOLN: 500.0000 mL | INTRAVENOUS | Status: DC

## 2016-03-22 NOTE — Progress Notes (Signed)
Called to room to assist during endoscopic procedure.  Patient ID and intended procedure confirmed with present staff. Received instructions for my participation in the procedure from the performing physician.  

## 2016-03-22 NOTE — Patient Instructions (Signed)

## 2016-03-22 NOTE — Op Note (Signed)
[facility] Patient Name: Logan [name] Procedure Date: 03/22/2016 10:48 AM MRN: [id number] Endoscopist: Ladene Artist , MD Age: 55 Referring MD:  Date of Birth: Mar 10, 1961 Gender: Male Procedure:                Colonoscopy Indications:              Screening in patient at increased risk: Family                            history of 1st-degree relative with colorectal                            cancer, Surveillance: Personal history of                            adenomatous polyps on last colonoscopy > 5 years ago Medicines:                Monitored Anesthesia Care Procedure:                Pre-Anesthesia Assessment:                           - Prior to the procedure, a History and Physical                            was performed, and patient medications and                            allergies were reviewed. The patient's tolerance of                            previous anesthesia was also reviewed. The risks                            and benefits of the procedure and the sedation                            options and risks were discussed with the patient.                            All questions were answered, and informed consent                            was obtained. Prior Anticoagulants: The patient has                            taken no previous anticoagulant or antiplatelet                            agents. ASA Grade Assessment: II - A patient with                            mild systemic disease. After reviewing the risks  and benefits, the patient was deemed in                            satisfactory condition to undergo the procedure.                           After obtaining informed consent, the colonoscope                            was passed under direct vision. Throughout the                            procedure, the patient's blood pressure, pulse, and                            oxygen saturations were monitored  continuously. The                            Model PCF-H190L [id number]) scope was introduced                            through the anus and advanced to the the cecum,                            identified by appendiceal orifice and ileocecal                            valve. The ileocecal valve, appendiceal orifice,                            and rectum were photographed. The quality of the                            bowel preparation was good. The colonoscopy was                            performed without difficulty. The patient tolerated                            the procedure well. Scope In: 10:59:33 AM Scope Out: 11:14:18 AM Scope Withdrawal Time: 0 hours 13 minutes 20 seconds  Total Procedure Duration: 0 hours 14 minutes 45 seconds  Findings:                 A 5 mm polyp was found in the sigmoid colon. The                            polyp was sessile. The polyp was removed with a                            cold biopsy forceps. Resection and retrieval were                            complete.  The exam was otherwise normal throughout the                            examined colon.                           The retroflexed view of the distal rectum and anal                            verge was normal and showed no anal or rectal                            abnormalities. Complications:            No immediate complications. Estimated blood loss:                            None. Estimated Blood Loss:     Estimated blood loss: none. Impression:               - One 5 mm polyp in the sigmoid colon, removed with                            a cold biopsy forceps. Resected and retrieved. Recommendation:           - Repeat colonoscopy in 5 years for surveillance.                           - Patient has a contact number available for                            emergencies. The signs and symptoms of potential                            delayed complications were  discussed with the                            patient. Return to normal activities tomorrow.                            Written discharge instructions were provided to the                            patient.                           - Resume previous diet.                           - Continue present medications.                           - Await pathology results. Ladene Artist, MD 03/22/2016 11:17:30 AM This report has been signed electronically.

## 2016-03-22 NOTE — Progress Notes (Signed)
Report to PACU, RN, vss, BBS= Clear.  

## 2016-03-22 NOTE — Op Note (Signed)
[place] Patient Name: Logan [name] Procedure Date: 03/22/2016 11:16 AM MRN: [id number] Endoscopist: Ladene Artist , MD Age: 55 Referring MD:  Date of Birth: 02/27/1961 Gender: Male Procedure:                Upper GI endoscopy Indications:              Surveillance for malignancy due to personal history                            of gastric adenoma Medicines:                Monitored Anesthesia Care Procedure:                Pre-Anesthesia Assessment:                           - Prior to the procedure, a History and Physical                            was performed, and patient medications and                            allergies were reviewed. The patient's tolerance of                            previous anesthesia was also reviewed. The risks                            and benefits of the procedure and the sedation                            options and risks were discussed with the patient.                            All questions were answered, and informed consent                            was obtained. Prior Anticoagulants: The patient has                            taken no previous anticoagulant or antiplatelet                            agents. ASA Grade Assessment: II - A patient with                            mild systemic disease. After reviewing the risks                            and benefits, the patient was deemed in                            satisfactory condition to undergo the procedure.                           -  Prior to the procedure, a History and Physical                            was performed, and patient medications and                            allergies were reviewed. The patient's tolerance of                            previous anesthesia was also reviewed. The risks                            and benefits of the procedure and the sedation                            options and risks were discussed with the patient.                 All questions were answered, and informed consent                            was obtained. Prior Anticoagulants: The patient has                            taken no previous anticoagulant or antiplatelet                            agents. ASA Grade Assessment: II - A patient with                            mild systemic disease. After reviewing the risks                            and benefits, the patient was deemed in                            satisfactory condition to undergo the procedure.                           After obtaining informed consent, the endoscope was                            passed under direct vision. Throughout the                            procedure, the patient's blood pressure, pulse, and                            oxygen saturations were monitored continuously. The                            Model [id number]) scope was introduced                            through  the mouth, and advanced to the second part                            of duodenum. The upper GI endoscopy was                            accomplished without difficulty. The patient                            tolerated the procedure well. Scope In: Scope Out: Findings:                 Two localized, small non-bleeding erosions were                            found in the gastric fundus. There were no stigmata                            of recent bleeding. Biopsies were taken with a cold                            forceps for histology.                           The exam of the stomach was otherwise normal.                           The examined esophagus was normal.                           The duodenal bulb and second portion of the                            duodenum were normal. Complications:            No immediate complications. Estimated Blood Loss:     Estimated blood loss: none. Impression:               - Non-bleeding erosive gastropathy. Biopsied.                            - Otherwise normal EGD. Recommendation:           - Patient has a contact number available for                            emergencies. The signs and symptoms of potential                            delayed complications were discussed with the                            patient. Return to normal activities tomorrow.                            Written discharge instructions were provided to the  patient.                           - Resume previous diet.                           - Continue present medications.                           - Await pathology results.                           - Repeat upper endoscopy in 5 years for                            surveillance. Ladene Artist, MD 03/22/2016 11:26:46 AM This report has been signed electronically.

## 2016-03-23 ENCOUNTER — Telehealth: Payer: Self-pay | Admitting: *Deleted

## 2016-03-23 NOTE — Telephone Encounter (Signed)
  Follow up Call-  Call back number 03/22/2016  Post procedure Call Back phone  # [phone]  Permission to leave phone message Yes     [facility] if any questions or concerns, MM RN

## 2016-04-02 ENCOUNTER — Encounter: Payer: Self-pay | Admitting: Gastroenterology

## 2021-06-14 ENCOUNTER — Encounter: Payer: Self-pay | Admitting: Gastroenterology

## 2022-02-26 ENCOUNTER — Encounter: Payer: Self-pay | Admitting: Gastroenterology

## 2022-03-28 ENCOUNTER — Ambulatory Visit (AMBULATORY_SURGERY_CENTER): Payer: Self-pay | Admitting: *Deleted

## 2022-03-28 VITALS — Ht 73.0 in | Wt 239.0 lb

## 2022-03-28 DIAGNOSIS — Z800 Family history of malignant neoplasm of digestive organs: Secondary | ICD-10-CM

## 2022-03-28 DIAGNOSIS — K317 Polyp of stomach and duodenum: Secondary | ICD-10-CM

## 2022-03-28 DIAGNOSIS — Z86010 Personal history of colonic polyps: Secondary | ICD-10-CM

## 2022-03-28 MED ORDER — NA SULFATE-K SULFATE-MG SULF 17.5-3.13-1.6 GM/177ML PO SOLN: 1.0000 | Freq: Once | ORAL | 0 refills | Status: AC

## 2022-03-28 NOTE — Progress Notes (Signed)
No egg or soy allergy known to patient  No issues known to pt with past sedation with any surgeries or procedures Patient denies ever being told they had issues or difficulty with intubation  No FH of Malignant Hyperthermia Pt is not on diet pills Pt is not on  home 02  Pt is not on blood thinners  Pt denies issues with constipation  No A fib or A flutter    NO PA's for preps discussed with pt In PV today  Discussed with pt there will be an out-of-pocket cost for prep and that varies from $0 to 70 +  dollars - pt verbalized understanding  Pt instructed to use Singlecare.com or GoodRx for a price reduction on prep

## 2022-04-18 ENCOUNTER — Encounter: Payer: Self-pay | Admitting: Gastroenterology

## 2022-04-24 ENCOUNTER — Encounter: Payer: Self-pay | Admitting: Certified Registered Nurse Anesthetist

## 2022-04-25 ENCOUNTER — Encounter: Payer: Self-pay | Admitting: Gastroenterology

## 2022-04-25 ENCOUNTER — Ambulatory Visit (AMBULATORY_SURGERY_CENTER): Payer: No Typology Code available for payment source | Admitting: Gastroenterology

## 2022-04-25 VITALS — BP 110/72 | HR 71 | Temp 96.8°F | Resp 15 | Ht 73.0 in | Wt 239.0 lb

## 2022-04-25 DIAGNOSIS — Z8719 Personal history of other diseases of the digestive system: Secondary | ICD-10-CM

## 2022-04-25 DIAGNOSIS — Z800 Family history of malignant neoplasm of digestive organs: Secondary | ICD-10-CM | POA: Diagnosis not present

## 2022-04-25 DIAGNOSIS — Z09 Encounter for follow-up examination after completed treatment for conditions other than malignant neoplasm: Secondary | ICD-10-CM | POA: Diagnosis not present

## 2022-04-25 DIAGNOSIS — D120 Benign neoplasm of cecum: Secondary | ICD-10-CM | POA: Diagnosis not present

## 2022-04-25 DIAGNOSIS — Z86010 Personal history of colonic polyps: Secondary | ICD-10-CM

## 2022-04-25 DIAGNOSIS — D123 Benign neoplasm of transverse colon: Secondary | ICD-10-CM

## 2022-04-25 NOTE — Progress Notes (Signed)
Report given to PACU, vss 

## 2022-04-25 NOTE — Progress Notes (Signed)
1114 Robinul 0.1 mg IV given due large amount of secretions upon assessment.  MD made aware, vss

## 2022-04-25 NOTE — Op Note (Signed)
[place] Patient Name: Logan [name] Procedure Date: 04/25/2022 11:13 AM MRN: [id number] Endoscopist: Ladene Artist , MD Age: 61 Referring MD:  Date of Birth: 19-Jun-1961 Gender: Male Account #: [id number] Procedure:                Upper GI endoscopy Indications:              Surveillance for malignancy due to personal history                            of gastric adenoma Medicines:                Monitored Anesthesia Care Procedure:                Pre-Anesthesia Assessment:                           - Prior to the procedure, a History and Physical                            was performed, and patient medications and                            allergies were reviewed. The patient's tolerance of                            previous anesthesia was also reviewed. The risks                            and benefits of the procedure and the sedation                            options and risks were discussed with the patient.                            All questions were answered, and informed consent                            was obtained. Prior Anticoagulants: The patient has                            taken no previous anticoagulant or antiplatelet                            agents. ASA Grade Assessment: II - A patient with                            mild systemic disease. After reviewing the risks                            and benefits, the patient was deemed in                            satisfactory condition to undergo the procedure.  After obtaining informed consent, the endoscope was                            passed under direct vision. Throughout the                            procedure, the patient's blood pressure, pulse, and                            oxygen saturations were monitored continuously. The                            GIF [id number] #[id number] was introduced through the                            mouth, and advanced to the second  part of duodenum.                            The upper GI endoscopy was accomplished without                            difficulty. The patient tolerated the procedure                            well. Scope In: Scope Out: Findings:                 The esophagus was normal.                           The stomach was normal.                           The examined duodenum was normal.                           The cardia and gastric fundus were normal on                            retroflexion. Complications:            No immediate complications. Estimated Blood Loss:     Estimated blood loss: none. Impression:               - Normal esophagus.                           - Normal stomach.                           - Normal examined duodenum.                           - No specimens collected. Recommendation:           - Patient has a contact number available for  emergencies. The signs and symptoms of potential                            delayed complications were discussed with the                            patient. Return to normal activities tomorrow.                            Written discharge instructions were provided to the                            patient.                           - Resume previous diet.                           - Continue present medications.                           - Repeat upper endoscopy in 5 years for                            surveillance. Ladene Artist, MD 04/25/2022 11:48:50 AM This report has been signed electronically.

## 2022-04-25 NOTE — Progress Notes (Signed)
Called to room to assist during endoscopic procedure.  Patient ID and intended procedure confirmed with present staff. Received instructions for my participation in the procedure from the performing physician.  

## 2022-04-25 NOTE — Op Note (Signed)
[place] Patient Name: Logan [name] Procedure Date: 04/25/2022 11:14 AM MRN: [id number] Endoscopist: Ladene Artist , MD Age: 61 Referring MD:  Date of Birth: 1961/06/23 Gender: Male Account #: [id number] Procedure:                Colonoscopy Indications:              Surveillance: Personal history of adenomatous                            polyps on last colonoscopy > 5 years ago, Family                            history of colon cancer 1st-degree relative. Medicines:                Monitored Anesthesia Care Procedure:                Pre-Anesthesia Assessment:                           - Prior to the procedure, a History and Physical                            was performed, and patient medications and                            allergies were reviewed. The patient's tolerance of                            previous anesthesia was also reviewed. The risks                            and benefits of the procedure and the sedation                            options and risks were discussed with the patient.                            All questions were answered, and informed consent                            was obtained. Prior Anticoagulants: The patient has                            taken no previous anticoagulant or antiplatelet                            agents. ASA Grade Assessment: II - A patient with                            mild systemic disease. After reviewing the risks                            and benefits, the patient was deemed in  satisfactory condition to undergo the procedure.                           After obtaining informed consent, the colonoscope                            was passed under direct vision. Throughout the                            procedure, the patient's blood pressure, pulse, and                            oxygen saturations were monitored continuously. The                            CF HQ190L #[id number]  was introduced through the anus                            and advanced to the the cecum, identified by                            appendiceal orifice and ileocecal valve. The                            ileocecal valve, appendiceal orifice, and rectum                            were photographed. The quality of the bowel                            preparation was good. The colonoscopy was performed                            without difficulty. The patient tolerated the                            procedure well. Scope In: 11:22:52 AM Scope Out: 11:38:30 AM Scope Withdrawal Time: 0 hours 13 minutes 57 seconds  Total Procedure Duration: 0 hours 15 minutes 38 seconds  Findings:                 The perianal and digital rectal examinations were                            normal.                           Two sessile polyps were found in the transverse                            colon and cecum. The polyps were 6 to 7 mm in size.                            These polyps were removed with a cold snare.  Resection and retrieval were complete.                           A few small-mouthed diverticula were found in the                            left colon. There was no evidence of diverticular                            bleeding.                           Internal hemorrhoids were found during                            retroflexion. The hemorrhoids were small and Grade                            I (internal hemorrhoids that do not prolapse).                           The exam was otherwise without abnormality on                            direct and retroflexion views. Complications:            No immediate complications. Estimated blood loss:                            None. Estimated Blood Loss:     Estimated blood loss: none. Impression:               - Two 6 to 7 mm polyps in the transverse colon and                            in the cecum, removed with a cold  snare. Resected                            and retrieved.                           - Mild diverticulosis in the left colon.                           - Internal hemorrhoids.                           - The examination was otherwise normal on direct                            and retroflexion views. Recommendation:           - Repeat colonoscopy, likely 5 years, after studies                            are complete for surveillance based on pathology  results.                           - Patient has a contact number available for                            emergencies. The signs and symptoms of potential                            delayed complications were discussed with the                            patient. Return to normal activities tomorrow.                            Written discharge instructions were provided to the                            patient.                           - Resume previous diet.                           - Continue present medications.                           - Await pathology results. Ladene Artist, MD 04/25/2022 11:46:44 AM This report has been signed electronically.

## 2022-04-25 NOTE — Patient Instructions (Signed)
**Handouts given on polyps and Hemorrhoids**   YOU HAD AN ENDOSCOPIC PROCEDURE TODAY AT [facility]:   Refer to the procedure report that was given to you for any specific questions about what was found during the examination.  If the procedure report does not answer your questions, please call your gastroenterologist to clarify.  If you requested that your care partner not be given the details of your procedure findings, then the procedure report has been included in a sealed envelope for you to review at your convenience later.  YOU SHOULD EXPECT: Some feelings of bloating in the abdomen. Passage of more gas than usual.  Walking can help get rid of the air that was put into your GI tract during the procedure and reduce the bloating. If you had a lower endoscopy (such as a colonoscopy or flexible sigmoidoscopy) you may notice spotting of blood in your stool or on the toilet paper. If you underwent a bowel prep for your procedure, you may not have a normal bowel movement for a few days.  Please Note:  You might notice some irritation and congestion in your nose or some drainage.  This is from the oxygen used during your procedure.  There is no need for concern and it should clear up in a day or so.  SYMPTOMS TO REPORT IMMEDIATELY:  Following lower endoscopy (colonoscopy or flexible sigmoidoscopy):  Excessive amounts of blood in the stool  Significant tenderness or worsening of abdominal pains  Swelling of the abdomen that is new, acute  Fever of 100F or higher  Following upper endoscopy (EGD)  Vomiting of blood or coffee ground material  New chest pain or pain under the shoulder blades  Painful or persistently difficult swallowing  New shortness of breath  Fever of 100F or higher  Black, tarry-looking stools  For urgent or emergent issues, a gastroenterologist can be reached at any hour by calling [phone]. Do not use MyChart messaging for urgent concerns.     DIET:  We do recommend a small meal at first, but then you may proceed to your regular diet.  Drink plenty of fluids but you should avoid alcoholic beverages for 24 hours.  ACTIVITY:  You should plan to take it easy for the rest of today and you should NOT DRIVE or use heavy machinery until tomorrow (because of the sedation medicines used during the test).    FOLLOW UP: Our staff will call the number listed on your records the next business day following your procedure.  We will call around 7:15- 8:00 am to check on you and address any questions or concerns that you may have regarding the information given to you following your procedure. If we do not reach you, we will leave a message.  If you develop any symptoms (ie: fever, flu-like symptoms, shortness of breath, cough etc.) before then, please call [phone].  If you test positive for Covid 19 in the 2 weeks post procedure, please call and report this information to [place].    If any biopsies were taken you will be contacted by phone or by letter within the next 1-3 weeks.  Please call us at [phone] if you have not heard about the biopsies in 3 weeks.    SIGNATURES/CONFIDENTIALITY: You and/or your care partner have signed paperwork which will be entered into your electronic medical record.  These signatures attest to the fact that that the information above on your After Visit Summary has been reviewed and  is understood.  Full responsibility of the confidentiality of this discharge information lies with you and/or your care-partner.

## 2022-04-25 NOTE — Progress Notes (Signed)
Pt's states no medical or surgical changes since previsit or office visit. 

## 2022-04-25 NOTE — Progress Notes (Signed)
History & Physical  Primary Care Physician:  Tyler Aas, MD Primary Gastroenterologist: Lucio Edward, MD  CHIEF COMPLAINT:  Personal history of colon polyps, [facility], personal history of gastric adenomatous colon polyp  HPI: Logan [name] is a 61 y.o. male with a personal history of adenomatous colon polyps, family history of colon cancer, first-degree relative, and personal history of gastric adenomatous colon polyp for colonoscopy and EGD.   Past Medical History:  Diagnosis Date   Allergy    Cataract    removed left, forming right   Glaucoma    high pressure left eye   Osteoarthritis (arthritis due to wear and tear of joints)    Panic disorder    Seasonal allergies     Past Surgical History:  Procedure Laterality Date   COLONOSCOPY     KNEE SURGERY     arthroscopic /Bil   POLYPECTOMY     REPLACEMENT TOTAL KNEE BILATERAL Bilateral    ROTATOR CUFF REPAIR     Bil   TONSILLECTOMY      Prior to Admission medications   Medication Sig Start Date End Date Taking? Authorizing Provider  alprazolam Duanne Moron) 2 MG tablet Take 2 mg by mouth 3 (three) times daily as needed. 03/02/22  Yes [provider]  celecoxib (CELEBREX) 200 MG capsule Take by mouth 2 (two) times daily. 03/20/22  Yes [provider]  OVER THE COUNTER MEDICATION Verve energy drink with vitamins daily   Yes [provider]  TENORMIN 50 MG tablet Take 1 tablet (50 mg total) by mouth 2 (two) times daily. 11/09/15  Yes Nafziger, Tommi Rumps, NP  VYZULTA 0.024 % SOLN  03/06/22  Yes [provider]  amoxicillin (AMOXIL) 500 MG capsule SMARTSIG:4 Capsule(s) By Mouth Patient not taking: Reported on 03/28/2022 01/23/22   [provider]  fexofenadine (ALLEGRA) 180 MG tablet Take 180 mg by mouth daily.    [provider]  LOVAZA 1 G capsule Take 1 capsule (1 g total) by mouth 2 (two) times daily. Patient not taking: Reported on 03/28/2022 02/23/15   Dorothyann Peng, NP     Current Outpatient Medications  Medication Sig Dispense Refill   alprazolam (XANAX) 2 MG tablet Take 2 mg by mouth 3 (three) times daily as needed.     celecoxib (CELEBREX) 200 MG capsule Take by mouth 2 (two) times daily.     OVER THE COUNTER MEDICATION Verve energy drink with vitamins daily     TENORMIN 50 MG tablet Take 1 tablet (50 mg total) by mouth 2 (two) times daily. 180 tablet 1   VYZULTA 0.024 % SOLN      amoxicillin (AMOXIL) 500 MG capsule SMARTSIG:4 Capsule(s) By Mouth (Patient not taking: Reported on 03/28/2022)     fexofenadine (ALLEGRA) 180 MG tablet Take 180 mg by mouth daily.     LOVAZA 1 G capsule Take 1 capsule (1 g total) by mouth 2 (two) times daily. (Patient not taking: Reported on 03/28/2022) 180 capsule 3   No current facility-administered medications for this visit.    Allergies as of 04/25/2022 - Review Complete 04/25/2022  Allergen Reaction Noted   Codeine Nausea Only 07/15/2007    Family History  Problem Relation Age of Onset   Colon cancer Mother        dx'd 44   Heart disease Father    Esophageal cancer Neg Hx    Colon polyps Neg Hx    Rectal cancer Neg Hx    Stomach cancer Neg Hx  Social History   Socioeconomic History   Marital status: Married    Spouse name: Not on file   Number of children: Not on file   Years of education: Not on file   Highest education level: Not on file  Occupational History   Not on file  Tobacco Use   Smoking status: Never   Smokeless tobacco: Never  Substance and Sexual Activity   Alcohol use: Yes    Alcohol/week: 2.0 standard drinks of alcohol    Types: 2 Standard drinks or equivalent per week    Comment: occ   Drug use: No   Sexual activity: Not on file  Other Topics Concern   Not on file  Social History Narrative   Not on file   Social Determinants of Health   Financial Resource Strain: Not on file  Food Insecurity: Not on file  Transportation Needs: Not on file  Physical Activity: Not on  file  Stress: Not on file  Social Connections: Not on file  Intimate Partner Violence: Not on file    Review of Systems:  All systems reviewed were negative except where noted in HPI.   Physical Exam: General:  Alert, well-developed, in NAD Head:  Normocephalic and atraumatic. Eyes:  Sclera clear, no icterus.   Conjunctiva pink. Ears:  Normal auditory acuity. Mouth:  No deformity or lesions.  Neck:  Supple; no masses . Lungs:  Clear throughout to auscultation.   No wheezes, crackles, or rhonchi. No acute distress. Heart:  Regular rate and rhythm; no murmurs. Abdomen:  Soft, nondistended, nontender. No masses, hepatomegaly. No obvious masses.  Normal bowel .    Rectal:  Deferred   Msk:  Symmetrical without gross deformities.. Pulses:  Normal pulses noted. Extremities:  Without edema. Neurologic:  Alert and  oriented x4;  grossly normal neurologically. Skin:  Intact without significant lesions or rashes. Cervical Nodes:  No significant cervical adenopathy. Psych:  Alert and cooperative. Normal mood and affect.   Impression / Plan:   Personal history of adenomatous colon polyps, family history of colon cancer, first-degree relative, and personal history of gastric adenomatous colon polyp for colonoscopy and EGD.  Pricilla Riffle. Fuller Plan  04/25/2022, 11:20 AM See Shea Evans, [facility], to contact our on call provider

## 2022-04-26 ENCOUNTER — Telehealth: Payer: Self-pay

## 2022-04-26 NOTE — Telephone Encounter (Signed)
  Follow up Call-     04/25/2022   10:16 AM  Call back number  Post procedure Call Back phone  # [phone]  Permission to leave phone message Yes     Patient questions:  Do you have a fever, pain , or abdominal swelling? No. Pain Score  0 *  Have you tolerated food without any problems? Yes.    Have you been able to return to your normal activities? Yes.    Do you have any questions about your discharge instructions: Diet   No. Medications  No. Follow up visit  No.  Do you have questions or concerns about your Care? No.  Actions: * If pain score is 4 or above: No action needed, pain <4.

## 2022-05-08 ENCOUNTER — Encounter: Payer: Self-pay | Admitting: Gastroenterology

## 2023-05-08 ENCOUNTER — Ambulatory Visit: Payer: No Typology Code available for payment source | Admitting: Plastic Surgery

## 2023-05-08 ENCOUNTER — Encounter: Payer: Self-pay | Admitting: Plastic Surgery

## 2023-05-08 VITALS — BP 138/71 | HR 58 | Ht 73.0 in | Wt 236.2 lb

## 2023-05-08 DIAGNOSIS — R2232 Localized swelling, mass and lump, left upper limb: Secondary | ICD-10-CM

## 2023-05-08 DIAGNOSIS — L989 Disorder of the skin and subcutaneous tissue, unspecified: Secondary | ICD-10-CM

## 2023-05-08 DIAGNOSIS — D489 Neoplasm of uncertain behavior, unspecified: Secondary | ICD-10-CM

## 2023-05-08 DIAGNOSIS — R220 Localized swelling, mass and lump, head: Secondary | ICD-10-CM

## 2023-05-08 NOTE — Progress Notes (Signed)
Referring Provider Vilinda Boehringer, MD [address] [place],  [place] [address]   CC:  Chief Complaint  Patient presents with   Consult      Logan [name] is an 62 y.o. male.  HPI: Logan [name] is a very pleasant 62 year old who is referred from his dermatologist for removal of 2 subcutaneous masses 1 on the inner aspect of the left wrist and 1 on the left occiput.  Is been there for quite some time though he is not exactly sure how long.  The subcutaneous mass in the scalp is painful especially when he is lying in bed.  Allergies  Allergen Reactions   Codeine Nausea Only    Outpatient Encounter Medications as of 05/08/2023  Medication Sig   alprazolam (XANAX) 2 MG tablet Take 2 mg by mouth 3 (three) times daily as needed.   fexofenadine (ALLEGRA) 180 MG tablet Take 180 mg by mouth daily.   OVER THE COUNTER MEDICATION Verve energy drink with vitamins daily   TENORMIN 50 MG tablet Take 1 tablet (50 mg total) by mouth 2 (two) times daily.   VYZULTA 0.024 % SOLN    amoxicillin (AMOXIL) 500 MG capsule SMARTSIG:4 Capsule(s) By Mouth (Patient not taking: Reported on 03/28/2022)   celecoxib (CELEBREX) 200 MG capsule Take by mouth 2 (two) times daily.   LOVAZA 1 G capsule Take 1 capsule (1 g total) by mouth 2 (two) times daily. (Patient not taking: Reported on 03/28/2022)   No facility-administered encounter medications on file as of 05/08/2023.     Past Medical History:  Diagnosis Date   Allergy    Cataract    removed left, forming right   Glaucoma    high pressure left eye   Osteoarthritis (arthritis due to wear and tear of joints)    Panic disorder    Seasonal allergies     Past Surgical History:  Procedure Laterality Date   COLONOSCOPY     KNEE SURGERY     arthroscopic /Bil   POLYPECTOMY     REPLACEMENT TOTAL KNEE BILATERAL Bilateral    ROTATOR CUFF REPAIR     Bil   TONSILLECTOMY      Family History  Problem Relation Age of Onset   Colon cancer Mother        dx'd  73   Heart disease Father    Esophageal cancer Neg Hx    Colon polyps Neg Hx    Rectal cancer Neg Hx    Stomach cancer Neg Hx     Social History   Social History Narrative   Not on file     Review of Systems General: Denies fevers, chills, weight loss CV: Denies chest pain, shortness of breath, palpitations Skin: Subcutaneous cyst that causes pain on the posterior aspect of his head and a mass on the inner portion of his left wrist  Physical Exam    05/08/2023    1:42 PM 04/25/2022   12:10 PM 04/25/2022   12:00 PM  Vitals with BMI  Height 6\' 1"     Weight 236 lbs 3 oz    BMI 31.17    Systolic 138 110 846  Diastolic 71 72 63  Pulse 58 71 53    General:  No acute distress,  Alert and oriented, Non-Toxic, Normal speech and affect Integument: There is a cyst on the left side of the occiput consistent with either sebaceous or pilar cyst.  There is a mass on the inner left wrist that is  consistent with a lipoma.  Assessment/Plan Skin lesion: Skin lesion is on the left side of the occiput and likely a cyst either sebaceous or pilar.  We discussed removal of this in the office.  I believe it is doable however patient may have had excess bleeding because of the location.  He is interested in proceeding with an office-based procedure. Subcutaneous mass: There is a subcutaneous mass on the inner portion of the left wrist.  This is probably a lipoma and would be easily amenable to excision in the office. Will schedule both for office procedures.  He understands that there is a risk of recurrence.  He understands that he will have a scar at both sites.  Will proceed at his request.  Santiago Glad 05/08/2023, 2:05 PM

## 2023-06-12 ENCOUNTER — Ambulatory Visit: Payer: No Typology Code available for payment source | Admitting: Plastic Surgery

## 2023-06-12 DIAGNOSIS — D1722 Benign lipomatous neoplasm of skin and subcutaneous tissue of left arm: Secondary | ICD-10-CM | POA: Diagnosis not present

## 2023-06-12 DIAGNOSIS — L7211 Pilar cyst: Secondary | ICD-10-CM | POA: Diagnosis not present

## 2023-06-12 DIAGNOSIS — D489 Neoplasm of uncertain behavior, unspecified: Secondary | ICD-10-CM

## 2023-06-12 DIAGNOSIS — L989 Disorder of the skin and subcutaneous tissue, unspecified: Secondary | ICD-10-CM

## 2023-06-12 NOTE — Addendum Note (Signed)
Addended by: Angelita Ingles on: 06/12/2023 11:42 AM   Modules accepted: Orders

## 2023-06-12 NOTE — Progress Notes (Addendum)
Procedure Note  Preoperative Dx: 1.  Cyst left occiput.  2.  Soft tissue mass left wrist  Postoperative Dx: Same  Procedure: Removal of scalp cyst- subfascial, removal of soft tissue mass, wrist- subcutaneous  Anesthesia: Lidocaine 1% with 1:100,000 epinephrine and 0.25% Sensorcaine   Indication for Procedure: Removal of cyst and mass for pathologic diagnosis  Description of Procedure: Risks and complications were explained to the patient including the possibility of bleeding and the possibility of needing additional surgery based on pathologic results.  Consent was confirmed and the patient understands the risks and benefits.  The potential complications and alternatives were explained and the patient consents.  The patient expressed understanding the option of not having the procedure and the risks of a scar.  Time out was called and all information was confirmed to be correct.    The area was prepped and drapped.  Local anesthetic was injected in the subcutaneous tissues.  After waiting for the local to take affect the cyst on the left occiput was addressed first.  A vertical incision was made over the mass and dissection carried out down through the scalp to the level of the cyst.  The cyst was removed without difficulty.  The cyst measured approximately 2 cm in size.  After inspecting the wound for bleeding the tissues were closed in layers with 4-0 Monocryl in the deep tissues and a running 5-0 Prolene in the skin.  The surgical wound measured 2 cm in length attention was then turned to the left wrist.  An incision was made over the mass and a combination of sharp and blunt dissection were used to isolate the mass and remove it.  The soft tissue mass which appeared to be a lipoma was approximately 2 cm in size..  After obtaining hemostasis, the surgical wound was closed with interrupted 4-0 Monocryl sutures in the deep tissues and interrupted 4-0 Prolene's and the skin.  The surgical wound  measured 1 cm.  A dressing was applied.  The patient was given instructions on how to care for the area and a follow up appointment.  Jefferey tolerated the procedure well and there were no complications.  Both specimens were sent to pathology.

## 2023-06-20 ENCOUNTER — Encounter: Payer: Self-pay | Admitting: Surgical

## 2023-06-20 ENCOUNTER — Ambulatory Visit (INDEPENDENT_AMBULATORY_CARE_PROVIDER_SITE_OTHER): Payer: Self-pay | Admitting: Surgical

## 2023-06-20 VITALS — BP 122/74 | HR 53

## 2023-06-20 DIAGNOSIS — Z9889 Other specified postprocedural states: Secondary | ICD-10-CM

## 2023-06-20 DIAGNOSIS — L989 Disorder of the skin and subcutaneous tissue, unspecified: Secondary | ICD-10-CM

## 2023-06-20 DIAGNOSIS — D489 Neoplasm of uncertain behavior, unspecified: Secondary | ICD-10-CM

## 2023-06-20 NOTE — Progress Notes (Signed)
Patient is a very pleasant 62 year old male here for follow-up after excision of left scalp cyst and soft tissue mass of left wrist.  Pathology for scalp cyst with a pilar cyst, soft tissue mass in the left wrist was a lipoma.  Patient is doing well, he is not having any issues at this time.  Prolene sutures are noted.  Prolene sutures were removed from the left occiput and left wrist incisions.  Overall the incisions appear to be healing well, there was a small amount of separation on the left wrist which was approximately 1 mm or less.  No signs of infection or concern on exam.  Recommend avoiding strenuous activities or heavy lifting for another week at minimum, placed a Band-Aid over the left wrist to apply decrease tension on the incision.  All the patient's questions were answered to his content, recommend following up as needed.
